# Patient Record
Sex: Female | Born: 1955 | Race: Black or African American | Hispanic: No
Health system: Midwestern US, Community
[De-identification: ages and names within clinical notes are randomized; demographics above are authoritative.]

## PROBLEM LIST (undated history)

## (undated) DIAGNOSIS — Z1231 Encounter for screening mammogram for malignant neoplasm of breast: Secondary | ICD-10-CM

## (undated) DIAGNOSIS — R109 Unspecified abdominal pain: Secondary | ICD-10-CM

## (undated) DIAGNOSIS — E119 Type 2 diabetes mellitus without complications: Secondary | ICD-10-CM

## (undated) DIAGNOSIS — I10 Essential (primary) hypertension: Secondary | ICD-10-CM

## (undated) DIAGNOSIS — M109 Gout, unspecified: Secondary | ICD-10-CM

## (undated) DIAGNOSIS — R053 Chronic cough: Secondary | ICD-10-CM

## (undated) DIAGNOSIS — R05 Cough: Secondary | ICD-10-CM

## (undated) DIAGNOSIS — R42 Dizziness and giddiness: Secondary | ICD-10-CM

## (undated) DIAGNOSIS — J449 Chronic obstructive pulmonary disease, unspecified: Secondary | ICD-10-CM

## (undated) DIAGNOSIS — C160 Malignant neoplasm of cardia: Secondary | ICD-10-CM

## (undated) DIAGNOSIS — Z1239 Encounter for other screening for malignant neoplasm of breast: Secondary | ICD-10-CM

## (undated) DIAGNOSIS — Z1211 Encounter for screening for malignant neoplasm of colon: Secondary | ICD-10-CM

## (undated) DIAGNOSIS — C7931 Secondary malignant neoplasm of brain: Secondary | ICD-10-CM

## (undated) DIAGNOSIS — Z79899 Other long term (current) drug therapy: Secondary | ICD-10-CM

---

## 1975-07-23 HISTORY — PX: HAND SURGERY: SHX662

## 2000-07-18 ENCOUNTER — Emergency Department (HOSPITAL_COMMUNITY): Admission: EM | Admit: 2000-07-18 | Discharge: 2000-07-18 | Payer: Self-pay | Admitting: Emergency Medicine

## 2000-09-22 ENCOUNTER — Emergency Department (HOSPITAL_COMMUNITY): Admission: EM | Admit: 2000-09-22 | Discharge: 2000-09-22 | Payer: Self-pay | Admitting: Emergency Medicine

## 2000-09-23 ENCOUNTER — Emergency Department (HOSPITAL_COMMUNITY): Admission: EM | Admit: 2000-09-23 | Discharge: 2000-09-23 | Payer: Self-pay | Admitting: Emergency Medicine

## 2001-03-11 ENCOUNTER — Emergency Department (HOSPITAL_COMMUNITY): Admission: EM | Admit: 2001-03-11 | Discharge: 2001-03-11 | Payer: Self-pay | Admitting: Emergency Medicine

## 2001-11-20 ENCOUNTER — Emergency Department (HOSPITAL_COMMUNITY): Admission: EM | Admit: 2001-11-20 | Discharge: 2001-11-20 | Payer: Self-pay | Admitting: Emergency Medicine

## 2001-11-20 ENCOUNTER — Encounter: Payer: Self-pay | Admitting: Emergency Medicine

## 2001-12-22 ENCOUNTER — Emergency Department (HOSPITAL_COMMUNITY): Admission: EM | Admit: 2001-12-22 | Discharge: 2001-12-22 | Payer: Self-pay

## 2004-05-21 ENCOUNTER — Ambulatory Visit: Payer: Self-pay | Admitting: Internal Medicine

## 2004-05-22 ENCOUNTER — Ambulatory Visit: Payer: Self-pay | Admitting: *Deleted

## 2004-06-30 ENCOUNTER — Emergency Department (HOSPITAL_COMMUNITY): Admission: EM | Admit: 2004-06-30 | Discharge: 2004-06-30 | Payer: Self-pay

## 2004-10-11 ENCOUNTER — Ambulatory Visit: Payer: Self-pay | Admitting: Internal Medicine

## 2005-05-21 ENCOUNTER — Ambulatory Visit: Payer: Self-pay | Admitting: Internal Medicine

## 2005-06-04 ENCOUNTER — Ambulatory Visit: Payer: Self-pay | Admitting: Internal Medicine

## 2005-06-24 ENCOUNTER — Ambulatory Visit: Payer: Self-pay | Admitting: Internal Medicine

## 2005-09-16 ENCOUNTER — Ambulatory Visit: Payer: Self-pay | Admitting: Internal Medicine

## 2006-03-07 ENCOUNTER — Ambulatory Visit: Payer: Self-pay | Admitting: Internal Medicine

## 2006-09-08 ENCOUNTER — Ambulatory Visit: Payer: Self-pay | Admitting: Internal Medicine

## 2006-10-06 ENCOUNTER — Encounter: Payer: Self-pay | Admitting: Internal Medicine

## 2006-10-06 ENCOUNTER — Ambulatory Visit: Payer: Self-pay | Admitting: Internal Medicine

## 2007-01-16 ENCOUNTER — Ambulatory Visit: Payer: Self-pay | Admitting: Internal Medicine

## 2007-01-19 ENCOUNTER — Ambulatory Visit (HOSPITAL_COMMUNITY): Admission: RE | Admit: 2007-01-19 | Discharge: 2007-01-19 | Payer: Self-pay | Admitting: Internal Medicine

## 2007-01-30 ENCOUNTER — Ambulatory Visit: Payer: Self-pay | Admitting: Internal Medicine

## 2007-04-08 ENCOUNTER — Encounter (INDEPENDENT_AMBULATORY_CARE_PROVIDER_SITE_OTHER): Payer: Self-pay | Admitting: *Deleted

## 2007-05-25 ENCOUNTER — Ambulatory Visit: Payer: Self-pay | Admitting: Internal Medicine

## 2007-05-28 ENCOUNTER — Ambulatory Visit: Payer: Self-pay | Admitting: Internal Medicine

## 2007-07-20 ENCOUNTER — Emergency Department (HOSPITAL_COMMUNITY): Admission: EM | Admit: 2007-07-20 | Discharge: 2007-07-20 | Payer: Self-pay | Admitting: Emergency Medicine

## 2007-07-30 ENCOUNTER — Ambulatory Visit: Payer: Self-pay | Admitting: Internal Medicine

## 2007-07-31 ENCOUNTER — Ambulatory Visit (HOSPITAL_COMMUNITY): Admission: RE | Admit: 2007-07-31 | Discharge: 2007-07-31 | Payer: Self-pay | Admitting: Family Medicine

## 2007-10-28 ENCOUNTER — Encounter: Payer: Self-pay | Admitting: Internal Medicine

## 2007-10-28 ENCOUNTER — Ambulatory Visit: Payer: Self-pay | Admitting: Internal Medicine

## 2007-10-28 LAB — CONVERTED CEMR LAB
CO2: 26 meq/L (ref 19–32)
Chloride: 107 meq/L (ref 96–112)
Glucose, Bld: 89 mg/dL (ref 70–99)
Potassium: 4 meq/L (ref 3.5–5.3)
Sodium: 141 meq/L (ref 135–145)

## 2008-04-01 ENCOUNTER — Emergency Department (HOSPITAL_COMMUNITY): Admission: EM | Admit: 2008-04-01 | Discharge: 2008-04-01 | Payer: Self-pay | Admitting: Emergency Medicine

## 2008-08-25 ENCOUNTER — Ambulatory Visit: Payer: Self-pay | Admitting: Internal Medicine

## 2008-08-25 LAB — CONVERTED CEMR LAB
BUN: 7 mg/dL (ref 6–23)
Chloride: 105 meq/L (ref 96–112)
Glucose, Bld: 93 mg/dL (ref 70–99)
LDL Cholesterol: 114 mg/dL — ABNORMAL HIGH (ref 0–99)
Potassium: 3.8 meq/L (ref 3.5–5.3)
VLDL: 16 mg/dL (ref 0–40)

## 2008-09-22 ENCOUNTER — Encounter: Payer: Self-pay | Admitting: Internal Medicine

## 2008-09-22 ENCOUNTER — Ambulatory Visit: Payer: Self-pay | Admitting: Internal Medicine

## 2008-09-22 LAB — CONVERTED CEMR LAB
Chlamydia, DNA Probe: NEGATIVE
GC Probe Amp, Genital: NEGATIVE

## 2008-09-23 ENCOUNTER — Ambulatory Visit (HOSPITAL_COMMUNITY): Admission: RE | Admit: 2008-09-23 | Discharge: 2008-09-23 | Payer: Self-pay | Admitting: Internal Medicine

## 2008-10-03 ENCOUNTER — Emergency Department (HOSPITAL_COMMUNITY): Admission: EM | Admit: 2008-10-03 | Discharge: 2008-10-03 | Payer: Self-pay | Admitting: Emergency Medicine

## 2008-11-07 ENCOUNTER — Ambulatory Visit: Payer: Self-pay | Admitting: Internal Medicine

## 2009-01-16 ENCOUNTER — Ambulatory Visit: Payer: Self-pay | Admitting: Internal Medicine

## 2009-06-28 ENCOUNTER — Ambulatory Visit: Payer: Self-pay | Admitting: Internal Medicine

## 2010-02-05 ENCOUNTER — Ambulatory Visit: Payer: Self-pay | Admitting: Internal Medicine

## 2010-02-05 ENCOUNTER — Ambulatory Visit (HOSPITAL_COMMUNITY): Admission: RE | Admit: 2010-02-05 | Discharge: 2010-02-05 | Payer: Self-pay | Admitting: Internal Medicine

## 2010-02-05 LAB — CONVERTED CEMR LAB: Pap Smear: NEGATIVE

## 2010-02-14 ENCOUNTER — Ambulatory Visit (HOSPITAL_COMMUNITY): Admission: RE | Admit: 2010-02-14 | Discharge: 2010-02-14 | Payer: Self-pay | Admitting: Internal Medicine

## 2011-01-16 ENCOUNTER — Emergency Department (HOSPITAL_COMMUNITY)
Admission: EM | Admit: 2011-01-16 | Discharge: 2011-01-16 | Disposition: A | Discharge status: Home / Self Care | Payer: Self-pay | Attending: Emergency Medicine | Admitting: Emergency Medicine

## 2011-01-16 DIAGNOSIS — L02818 Cutaneous abscess of other sites: Secondary | ICD-10-CM | POA: Insufficient documentation

## 2011-01-16 DIAGNOSIS — L03818 Cellulitis of other sites: Secondary | ICD-10-CM | POA: Insufficient documentation

## 2011-01-16 DIAGNOSIS — I10 Essential (primary) hypertension: Secondary | ICD-10-CM | POA: Insufficient documentation

## 2011-01-16 DIAGNOSIS — I509 Heart failure, unspecified: Secondary | ICD-10-CM | POA: Insufficient documentation

## 2013-12-02 ENCOUNTER — Ambulatory Visit: Payer: No Typology Code available for payment source | Attending: Internal Medicine

## 2013-12-17 ENCOUNTER — Ambulatory Visit (INDEPENDENT_AMBULATORY_CARE_PROVIDER_SITE_OTHER): Payer: No Typology Code available for payment source | Admitting: Family Medicine

## 2013-12-17 ENCOUNTER — Other Ambulatory Visit: Payer: Self-pay | Admitting: Internal Medicine

## 2013-12-17 ENCOUNTER — Encounter: Payer: Self-pay | Admitting: Family Medicine

## 2013-12-17 VITALS — BP 153/94 | HR 74 | Temp 98.3°F | Resp 20 | Ht 66.0 in | Wt 180.0 lb

## 2013-12-17 DIAGNOSIS — R635 Abnormal weight gain: Secondary | ICD-10-CM

## 2013-12-17 DIAGNOSIS — M109 Gout, unspecified: Secondary | ICD-10-CM

## 2013-12-17 DIAGNOSIS — Z862 Personal history of diseases of the blood and blood-forming organs and certain disorders involving the immune mechanism: Secondary | ICD-10-CM

## 2013-12-17 DIAGNOSIS — Z8739 Personal history of other diseases of the musculoskeletal system and connective tissue: Secondary | ICD-10-CM

## 2013-12-17 DIAGNOSIS — I10 Essential (primary) hypertension: Secondary | ICD-10-CM

## 2013-12-17 DIAGNOSIS — Z8639 Personal history of other endocrine, nutritional and metabolic disease: Secondary | ICD-10-CM

## 2013-12-17 DIAGNOSIS — M79675 Pain in left toe(s): Secondary | ICD-10-CM

## 2013-12-17 DIAGNOSIS — Z1231 Encounter for screening mammogram for malignant neoplasm of breast: Secondary | ICD-10-CM

## 2013-12-17 DIAGNOSIS — M79609 Pain in unspecified limb: Secondary | ICD-10-CM

## 2013-12-17 LAB — COMPLETE METABOLIC PANEL WITH GFR
ALK PHOS: 68 U/L (ref 39–117)
ALT: 12 U/L (ref 0–35)
AST: 14 U/L (ref 0–37)
Albumin: 4.2 g/dL (ref 3.5–5.2)
BILIRUBIN TOTAL: 0.4 mg/dL (ref 0.2–1.2)
BUN: 8 mg/dL (ref 6–23)
CO2: 25 mEq/L (ref 19–32)
Calcium: 9.2 mg/dL (ref 8.4–10.5)
Chloride: 104 mEq/L (ref 96–112)
Creat: 0.89 mg/dL (ref 0.50–1.10)
GFR, EST NON AFRICAN AMERICAN: 72 mL/min
GFR, Est African American: 83 mL/min
GLUCOSE: 83 mg/dL (ref 70–99)
Potassium: 4.1 mEq/L (ref 3.5–5.3)
SODIUM: 139 meq/L (ref 135–145)
TOTAL PROTEIN: 6.9 g/dL (ref 6.0–8.3)

## 2013-12-17 LAB — CBC WITH DIFFERENTIAL/PLATELET
BASOS ABS: 0.1 10*3/uL (ref 0.0–0.1)
BASOS PCT: 1 % (ref 0–1)
EOS ABS: 0.3 10*3/uL (ref 0.0–0.7)
EOS PCT: 4 % (ref 0–5)
HEMATOCRIT: 37.5 % (ref 36.0–46.0)
Hemoglobin: 12 g/dL (ref 12.0–15.0)
Lymphocytes Relative: 48 % — ABNORMAL HIGH (ref 12–46)
Lymphs Abs: 3.2 10*3/uL (ref 0.7–4.0)
MCH: 23 pg — ABNORMAL LOW (ref 26.0–34.0)
MCHC: 32 g/dL (ref 30.0–36.0)
MCV: 72 fL — AB (ref 78.0–100.0)
MONO ABS: 0.5 10*3/uL (ref 0.1–1.0)
Monocytes Relative: 7 % (ref 3–12)
Neutro Abs: 2.6 10*3/uL (ref 1.7–7.7)
Neutrophils Relative %: 40 % — ABNORMAL LOW (ref 43–77)
Platelets: 236 10*3/uL (ref 150–400)
RBC: 5.21 MIL/uL — ABNORMAL HIGH (ref 3.87–5.11)
RDW: 15.5 % (ref 11.5–15.5)
WBC: 6.6 10*3/uL (ref 4.0–10.5)

## 2013-12-17 LAB — HEMOGLOBIN A1C
Hgb A1c MFr Bld: 6.6 % — ABNORMAL HIGH (ref <5.7)
Mean Plasma Glucose: 143 mg/dL — ABNORMAL HIGH (ref <117)

## 2013-12-17 LAB — URIC ACID: Uric Acid, Serum: 4.3 mg/dL (ref 2.4–7.0)

## 2013-12-17 MED ORDER — IBUPROFEN 600 MG PO TABS: 600.0000 mg | ORAL_TABLET | Freq: Three times a day (TID) | ORAL | Status: DC | PRN

## 2013-12-17 NOTE — Progress Notes (Signed)
   Subjective:    Patient ID: Nichole Best, female    DOB: 11-30-1955, 58 y.o.   MRN: [id number]  HPI Patient in office to establish care.  Patient has a history of hypertension. Reports that she does not follow a consistent diet regimen, but remains active. She takes her medications consistently.   Review of Systems  Constitutional: Negative.   HENT: Negative.   Eyes: Negative.   Respiratory: Negative.   Cardiovascular: Negative.   Gastrointestinal: Negative.   Endocrine: Negative.   Genitourinary: Negative.   Musculoskeletal: Positive for arthralgias, joint swelling and myalgias.  Skin: Negative.   Allergic/Immunologic: Negative.   Neurological: Positive for dizziness (vertigo).  Hematological: Negative.   Psychiatric/Behavioral: Negative.        Objective:   Physical Exam  Constitutional: She is oriented to person, place, and time. She appears well-developed and well-nourished.  HENT:  Head: Normocephalic and atraumatic.  Eyes: Pupils are equal, round, and reactive to light.  Neck: Normal range of motion. Neck supple.  Cardiovascular: Normal rate, regular rhythm and normal heart sounds.   Pulmonary/Chest: Effort normal and breath sounds normal.  Abdominal: Soft. Bowel sounds are normal.  Musculoskeletal: Normal range of motion. She exhibits edema.       Left ankle: She exhibits normal range of motion and no swelling. Tenderness.  Neurological: She is alert and oriented to person, place, and time. She has normal reflexes.  Skin: Skin is warm and dry.  Psychiatric: She has a normal mood and affect. Her behavior is normal. Judgment and thought content normal.          Assessment & Plan:  1. Hypertension: Continue current medication regimen. Will review medical records as they become available. Recommend 1800 calorie diet divided over 6 small meals. Increase water intake to 6-8 glasses.  Continue medications regimen. Check CMP, urinalysis.  2. Left great toe pain:  Start Ibuprofen 600 mg three times daily as needed with food.  3. History of gout- Check uric acid level 4. Weight gain-Check CMP, Hgb A1C 5. Dizziness: Reports that she occasionally has dizziness. Orthostatic blood pressures within normal limits. Denies ear ringing, headache, weakness, or blurred vision. Patient to Continue Meclizine 12.5 mg three times per day as previously prescribed.  Post menopausal- LMP-Nov 2011  Last eye exam: 1 month ago at the [facility] Post menopausal Colonoscopy greater than 7 years ago. Dr. Jules Schick Breast Cancer Screening 2 years ago-Sent Referral for screening mammogram  Vaccinations: Tetanus in 2013 [facility]).    Labs: HbA1c, CMP, CBC w/diff Will review medical records as they become available  RTC: 3 months for a CPE with DR. Ashley Royalty.

## 2013-12-18 LAB — URINALYSIS, COMPLETE
Bacteria, UA: NONE SEEN
Bilirubin Urine: NEGATIVE
CASTS: NONE SEEN
CRYSTALS: NONE SEEN
GLUCOSE, UA: NEGATIVE mg/dL
Hgb urine dipstick: NEGATIVE
KETONES UR: NEGATIVE mg/dL
LEUKOCYTES UA: NEGATIVE
NITRITE: NEGATIVE
PH: 6.5 (ref 5.0–8.0)
Protein, ur: NEGATIVE mg/dL
SPECIFIC GRAVITY, URINE: 1.012 (ref 1.005–1.030)
Urobilinogen, UA: 0.2 mg/dL (ref 0.0–1.0)

## 2013-12-20 ENCOUNTER — Encounter: Payer: Self-pay | Admitting: Family Medicine

## 2013-12-21 DIAGNOSIS — Z1231 Encounter for screening mammogram for malignant neoplasm of breast: Secondary | ICD-10-CM | POA: Insufficient documentation

## 2013-12-21 DIAGNOSIS — Z8739 Personal history of other diseases of the musculoskeletal system and connective tissue: Secondary | ICD-10-CM | POA: Insufficient documentation

## 2013-12-21 DIAGNOSIS — I10 Essential (primary) hypertension: Secondary | ICD-10-CM | POA: Insufficient documentation

## 2013-12-21 DIAGNOSIS — R635 Abnormal weight gain: Secondary | ICD-10-CM | POA: Insufficient documentation

## 2013-12-21 DIAGNOSIS — M79675 Pain in left toe(s): Secondary | ICD-10-CM | POA: Insufficient documentation

## 2013-12-21 DIAGNOSIS — M109 Gout, unspecified: Secondary | ICD-10-CM | POA: Insufficient documentation

## 2013-12-30 ENCOUNTER — Ambulatory Visit (HOSPITAL_COMMUNITY)
Admission: RE | Admit: 2013-12-30 | Discharge: 2013-12-30 | Disposition: A | Discharge status: Home / Self Care | Payer: No Typology Code available for payment source | Source: Ambulatory Visit | Attending: Family Medicine | Admitting: Family Medicine

## 2013-12-30 DIAGNOSIS — Z1231 Encounter for screening mammogram for malignant neoplasm of breast: Secondary | ICD-10-CM | POA: Insufficient documentation

## 2014-03-24 ENCOUNTER — Encounter: Payer: No Typology Code available for payment source | Admitting: Internal Medicine

## 2014-05-06 ENCOUNTER — Other Ambulatory Visit: Payer: Self-pay

## 2015-05-01 ENCOUNTER — Telehealth: Payer: Self-pay | Admitting: Family Medicine

## 2015-05-01 NOTE — Telephone Encounter (Signed)
Received medical records request from [employer]. Forwarded to [employer].

## 2015-09-24 ENCOUNTER — Emergency Department (HOSPITAL_COMMUNITY): Payer: Medicare Other

## 2015-09-24 ENCOUNTER — Encounter (HOSPITAL_COMMUNITY): Payer: Self-pay | Admitting: *Deleted

## 2015-09-24 ENCOUNTER — Emergency Department (HOSPITAL_COMMUNITY)
Admission: EM | Admit: 2015-09-24 | Discharge: 2015-09-24 | Disposition: A | Discharge status: Home / Self Care | Payer: Medicare Other | Attending: Emergency Medicine | Admitting: Emergency Medicine

## 2015-09-24 DIAGNOSIS — R42 Dizziness and giddiness: Secondary | ICD-10-CM | POA: Diagnosis present

## 2015-09-24 DIAGNOSIS — F17210 Nicotine dependence, cigarettes, uncomplicated: Secondary | ICD-10-CM | POA: Insufficient documentation

## 2015-09-24 DIAGNOSIS — E119 Type 2 diabetes mellitus without complications: Secondary | ICD-10-CM | POA: Diagnosis not present

## 2015-09-24 DIAGNOSIS — Z79899 Other long term (current) drug therapy: Secondary | ICD-10-CM | POA: Diagnosis not present

## 2015-09-24 DIAGNOSIS — R110 Nausea: Secondary | ICD-10-CM | POA: Diagnosis not present

## 2015-09-24 DIAGNOSIS — I10 Essential (primary) hypertension: Secondary | ICD-10-CM | POA: Insufficient documentation

## 2015-09-24 DIAGNOSIS — R0981 Nasal congestion: Secondary | ICD-10-CM | POA: Diagnosis not present

## 2015-09-24 HISTORY — DX: Essential (primary) hypertension: I10

## 2015-09-24 HISTORY — DX: Dizziness and giddiness: R42

## 2015-09-24 HISTORY — DX: Type 2 diabetes mellitus without complications: E11.9

## 2015-09-24 LAB — URINALYSIS, ROUTINE W REFLEX MICROSCOPIC
BILIRUBIN URINE: NEGATIVE
Glucose, UA: NEGATIVE mg/dL
HGB URINE DIPSTICK: NEGATIVE
Ketones, ur: NEGATIVE mg/dL
Nitrite: NEGATIVE
PH: 5.5 (ref 5.0–8.0)
Protein, ur: NEGATIVE mg/dL
SPECIFIC GRAVITY, URINE: 1.02 (ref 1.005–1.030)

## 2015-09-24 LAB — PROTIME-INR
INR: 1.04 (ref 0.00–1.49)
PROTHROMBIN TIME: 13.8 s (ref 11.6–15.2)

## 2015-09-24 LAB — CBC
HCT: 37.5 % (ref 36.0–46.0)
HEMOGLOBIN: 11.9 g/dL — AB (ref 12.0–15.0)
MCH: 23.6 pg — AB (ref 26.0–34.0)
MCHC: 31.7 g/dL (ref 30.0–36.0)
MCV: 74.3 fL — ABNORMAL LOW (ref 78.0–100.0)
Platelets: 245 10*3/uL (ref 150–400)
RBC: 5.05 MIL/uL (ref 3.87–5.11)
RDW: 15.1 % (ref 11.5–15.5)
WBC: 7.1 10*3/uL (ref 4.0–10.5)

## 2015-09-24 LAB — COMPREHENSIVE METABOLIC PANEL
ALBUMIN: 3.8 g/dL (ref 3.5–5.0)
ALK PHOS: 64 U/L (ref 38–126)
ALT: 14 U/L (ref 14–54)
AST: 17 U/L (ref 15–41)
Anion gap: 11 (ref 5–15)
BILIRUBIN TOTAL: 0.3 mg/dL (ref 0.3–1.2)
BUN: 7 mg/dL (ref 6–20)
CALCIUM: 9.2 mg/dL (ref 8.9–10.3)
CO2: 26 mmol/L (ref 22–32)
Chloride: 105 mmol/L (ref 101–111)
Creatinine, Ser: 0.9 mg/dL (ref 0.44–1.00)
GFR calc Af Amer: >60 mL/min (ref >60)
GFR calc non Af Amer: >60 mL/min (ref >60)
GLUCOSE: 93 mg/dL (ref 65–99)
Potassium: 4.1 mmol/L (ref 3.5–5.1)
Sodium: 142 mmol/L (ref 135–145)
TOTAL PROTEIN: 6.7 g/dL (ref 6.5–8.1)

## 2015-09-24 LAB — URINE MICROSCOPIC-ADD ON

## 2015-09-24 LAB — CBG MONITORING, ED: Glucose-Capillary: 80 mg/dL (ref 65–99)

## 2015-09-24 LAB — DIFFERENTIAL
BASOS ABS: 0 10*3/uL (ref 0.0–0.1)
Basophils Relative: 0 %
Eosinophils Absolute: 0.2 10*3/uL (ref 0.0–0.7)
Eosinophils Relative: 3 %
LYMPHS ABS: 3.8 10*3/uL (ref 0.7–4.0)
LYMPHS PCT: 54 %
Monocytes Absolute: 0.4 10*3/uL (ref 0.1–1.0)
Monocytes Relative: 6 %
NEUTROS PCT: 37 %
Neutro Abs: 2.6 10*3/uL (ref 1.7–7.7)

## 2015-09-24 LAB — APTT: aPTT: 31 seconds (ref 24–37)

## 2015-09-24 LAB — I-STAT TROPONIN, ED: Troponin i, poc: 0 ng/mL (ref 0.00–0.08)

## 2015-09-24 MED ORDER — MECLIZINE HCL 25 MG PO TABS
25.0000 mg | ORAL_TABLET | Freq: Once | ORAL | Status: AC
Start: 2015-09-24 — End: 2015-09-24
  Administered 2015-09-24: 25 mg via ORAL
  Filled 2015-09-24: qty 1

## 2015-09-24 MED ORDER — MECLIZINE HCL 25 MG PO TABS: 25.0000 mg | ORAL_TABLET | Freq: Three times a day (TID) | ORAL | Status: DC | PRN

## 2015-09-24 NOTE — Discharge Instructions (Signed)
Benign Positional Vertigo °Vertigo is the feeling that you or your surroundings are moving when they are not. Benign positional vertigo is the most common form of vertigo. The cause of this condition is not serious (is benign). This condition is triggered by certain movements and positions (is positional). This condition can be dangerous if it occurs while you are doing something that could endanger you or others, such as driving.  °CAUSES °In many cases, the cause of this condition is not known. It may be caused by a disturbance in an area of the inner ear that helps your brain to sense movement and balance. This disturbance can be caused by a viral infection (labyrinthitis), head injury, or repetitive motion. °RISK FACTORS °This condition is more likely to develop in: °· Women. °· People who are 50 years of age or older. °SYMPTOMS °Symptoms of this condition usually happen when you move your head or your eyes in different directions. Symptoms may start suddenly, and they usually last for less than a minute. Symptoms may include: °· Loss of balance and falling. °· Feeling like you are spinning or moving. °· Feeling like your surroundings are spinning or moving. °· Nausea and vomiting. °· Blurred vision. °· Dizziness. °· Involuntary eye movement (nystagmus). °Symptoms can be mild and cause only slight annoyance, or they can be severe and interfere with daily life. Episodes of benign positional vertigo may return (recur) over time, and they may be triggered by certain movements. Symptoms may improve over time. °DIAGNOSIS °This condition is usually diagnosed by medical history and a physical exam of the head, neck, and ears. You may be referred to a health care provider who specializes in ear, nose, and throat (ENT) problems (otolaryngologist) or a provider who specializes in disorders of the nervous system (neurologist). You may have additional testing, including: °· MRI. °· A CT scan. °· Eye movement tests. Your  health care provider may ask you to change positions quickly while he or she watches you for symptoms of benign positional vertigo, such as nystagmus. Eye movement may be tested with an electronystagmogram (ENG), caloric stimulation, the Dix-Hallpike test, or the roll test. °· An electroencephalogram (EEG). This records electrical activity in your brain. °· Hearing tests. °TREATMENT °Usually, your health care provider will treat this by moving your head in specific positions to adjust your inner ear back to normal. Surgery may be needed in severe cases, but this is rare. In some cases, benign positional vertigo may resolve on its own in 2-4 weeks. °HOME CARE INSTRUCTIONS °Safety °· Move slowly. Avoid sudden body or head movements. °· Avoid driving. °· Avoid operating heavy machinery. °· Avoid doing any tasks that would be dangerous to you or others if a vertigo episode would occur. °· If you have trouble walking or keeping your balance, try using a cane for stability. If you feel dizzy or unstable, sit down right away. °· Return to your normal activities as told by your health care provider. Ask your health care provider what activities are safe for you. °General Instructions °· Take over-the-counter and prescription medicines only as told by your health care provider. °· Avoid certain positions or movements as told by your health care provider. °· Drink enough fluid to keep your urine clear or pale yellow. °· Keep all follow-up visits as told by your health care provider. This is important. °SEEK MEDICAL CARE IF: °· You have a fever. °· Your condition gets worse or you develop new symptoms. °· Your family or friends   notice any behavioral changes.  Your nausea or vomiting gets worse.  You have numbness or a "pins and needles" sensation. SEEK IMMEDIATE MEDICAL CARE IF:  You have difficulty speaking or moving.  You are always dizzy.  You faint.  You develop severe headaches.  You have weakness in your  legs or arms.  You have changes in your hearing or vision.  You develop a stiff neck.  You develop sensitivity to light.   This information is not intended to replace advice given to you by your health care provider. Make sure you discuss any questions you have with your health care provider.   Take the Antivert as directed. Make an appointment to follow-up with the wellness clinic. Return for any new or worse symptoms. Today's workup to include MRI of the brain without any acute findings. Patient cleared to return back to shelter.   DPatient with historyocument Released: 04/15/2006 Document Revised: 03/29/2015 Document Reviewed: 10/31/2014 Elsevier Interactive Patient Education [employer]2016 [employer] Inc.

## 2015-09-24 NOTE — ED Notes (Addendum)
Pt reports feeling lightheaded and dizzy since she woke up at 4am. No neuro deficits noted at triage. Pt reports hx of vertigo.

## 2015-09-24 NOTE — ED Provider Notes (Signed)
CSN: [id number]     Arrival date & time 09/24/15  1157 History   First MD Initiated Contact with Patient 09/24/15 1522     Chief Complaint  Patient presents with  . Dizziness     (Consider location/radiation/quality/duration/timing/severity/associated sxs/prior Treatment) Patient is a 60 y.o. female presenting with dizziness. The history is provided by the patient.  Dizziness Associated symptoms: nausea   Associated symptoms: no chest pain, no headaches and no shortness of breath    patient with acute onset of room spinning dizziness a little bit of lightheadedness mild congestion all starting at 4:00 this morning. Patient without fever. No cough. Patient had a history of vertigo several years ago in [place] was treated with meclizine. Patient's past medical history has a history of diabetes without complications.  Past Medical History  Diagnosis Date  . Diabetes mellitus without complication (HCC)   . Hypertension   . Vertigo    History reviewed. No pertinent past surgical history. History reviewed. No pertinent family history. Social History  Substance Use Topics  . Smoking status: Current Every Day Smoker -- 0.25 packs/day    Types: Cigarettes  . Smokeless tobacco: None     Comment: Pt has been smoking since age 79  . Alcohol Use: No     Comment: Caffene user   OB History    No data available     Review of Systems  Constitutional: Negative for fever.  HENT: Positive for congestion.   Eyes: Negative for visual disturbance.  Respiratory: Negative for shortness of breath.   Cardiovascular: Negative for chest pain.  Gastrointestinal: Positive for nausea. Negative for abdominal pain.  Genitourinary: Negative for dysuria.  Musculoskeletal: Negative for back pain and neck pain.  Skin: Negative for rash.  Neurological: Positive for dizziness and light-headedness. Negative for headaches.  Hematological: Does not bruise/bleed easily.  Psychiatric/Behavioral: Negative for  confusion.      Allergies  Codeine and Lisinopril  Home Medications   Prior to Admission medications   Medication Sig Start Date End Date Taking? Authorizing Provider  losartan (COZAAR) 50 MG tablet Take 50 mg by mouth daily.   Yes Historical Provider, MD  naproxen sodium (ANAPROX) 220 MG tablet Take 220 mg by mouth 2 (two) times daily as needed (for pain).   Yes Historical Provider, MD  ibuprofen (ADVIL,MOTRIN) 600 MG tablet Take 1 tablet (600 mg total) by mouth every 8 (eight) hours as needed. 12/17/13   Massie Maroon, FNP  meclizine (ANTIVERT) 25 MG tablet Take 1 tablet (25 mg total) by mouth 3 (three) times daily as needed for dizziness. 09/24/15   Vanetta Mulders, MD   BP 137/89 mmHg  Pulse 73  Temp(Src) 98.4 F (36.9 C) (Oral)  Resp 17  SpO2 96% Physical Exam  Constitutional: She is oriented to person, place, and time. She appears well-developed and well-nourished. No distress.  HENT:  Head: Normocephalic and atraumatic.  Right Ear: External ear normal.  Left Ear: External ear normal.  Mouth/Throat: Oropharynx is clear and moist.  Extraocular movements and head movement illicit dizziness.  Eyes: Conjunctivae and EOM are normal. Pupils are equal, round, and reactive to light.  Neck: Normal range of motion. Neck supple.  Cardiovascular: Normal rate, regular rhythm and normal heart sounds.   No murmur heard. Pulmonary/Chest: Effort normal and breath sounds normal.  Abdominal: Soft. Bowel sounds are normal. There is no tenderness.  Musculoskeletal: Normal range of motion.  Neurological: She is alert and oriented to person, place, and time. No  cranial nerve deficit. She exhibits normal muscle tone. Coordination normal.  Skin: Skin is warm. No rash noted.  Nursing note and vitals reviewed.   ED Course  Procedures (including critical care time) Labs Review Labs Reviewed  CBC - Abnormal; Notable for the following:    Hemoglobin 11.9 (*)    MCV 74.3 (*)    MCH 23.6 (*)     All other components within normal limits  URINALYSIS, ROUTINE W REFLEX MICROSCOPIC (NOT AT [facility]) - Abnormal; Notable for the following:    Leukocytes, UA TRACE (*)    All other components within normal limits  URINE MICROSCOPIC-ADD ON - Abnormal; Notable for the following:    Squamous Epithelial / LPF 0-5 (*)    Bacteria, UA RARE (*)    All other components within normal limits  PROTIME-INR  APTT  DIFFERENTIAL  COMPREHENSIVE METABOLIC PANEL  CBG MONITORING, ED  I-STAT TROPOININ, ED  CBG MONITORING, ED   Results for orders placed or performed during the hospital encounter of 09/24/15  Protime-INR  Result Value Ref Range   Prothrombin Time 13.8 11.6 - 15.2 seconds   INR 1.04 0.00 - 1.49  APTT  Result Value Ref Range   aPTT 31 24 - 37 seconds  CBC  Result Value Ref Range   WBC 7.1 4.0 - 10.5 K/uL   RBC 5.05 3.87 - 5.11 MIL/uL   Hemoglobin 11.9 (L) 12.0 - 15.0 g/dL   HCT 16.1 09.6 - 04.5 %   MCV 74.3 (L) 78.0 - 100.0 fL   MCH 23.6 (L) 26.0 - 34.0 pg   MCHC 31.7 30.0 - 36.0 g/dL   RDW 40.9 81.1 - 91.4 %   Platelets 245 150 - 400 K/uL  Differential  Result Value Ref Range   Neutrophils Relative % 37 %   Neutro Abs 2.6 1.7 - 7.7 K/uL   Lymphocytes Relative 54 %   Lymphs Abs 3.8 0.7 - 4.0 K/uL   Monocytes Relative 6 %   Monocytes Absolute 0.4 0.1 - 1.0 K/uL   Eosinophils Relative 3 %   Eosinophils Absolute 0.2 0.0 - 0.7 K/uL   Basophils Relative 0 %   Basophils Absolute 0.0 0.0 - 0.1 K/uL  Comprehensive metabolic panel  Result Value Ref Range   Sodium 142 135 - 145 mmol/L   Potassium 4.1 3.5 - 5.1 mmol/L   Chloride 105 101 - 111 mmol/L   CO2 26 22 - 32 mmol/L   Glucose, Bld 93 65 - 99 mg/dL   BUN 7 6 - 20 mg/dL   Creatinine, Ser 7.82 0.44 - 1.00 mg/dL   Calcium 9.2 8.9 - 95.6 mg/dL   Total Protein 6.7 6.5 - 8.1 g/dL   Albumin 3.8 3.5 - 5.0 g/dL   AST 17 15 - 41 U/L   ALT 14 14 - 54 U/L   Alkaline Phosphatase 64 38 - 126 U/L   Total Bilirubin 0.3 0.3 - 1.2  mg/dL   GFR calc non Af Amer >60 >60 mL/min   GFR calc Af Amer >60 >60 mL/min   Anion gap 11 5 - 15  Urinalysis, Routine w reflex microscopic (not at [facility])  Result Value Ref Range   Color, Urine YELLOW YELLOW   APPearance CLEAR CLEAR   Specific Gravity, Urine 1.020 1.005 - 1.030   pH 5.5 5.0 - 8.0   Glucose, UA NEGATIVE NEGATIVE mg/dL   Hgb urine dipstick NEGATIVE NEGATIVE   Bilirubin Urine NEGATIVE NEGATIVE   Ketones, ur NEGATIVE NEGATIVE  mg/dL   Protein, ur NEGATIVE NEGATIVE mg/dL   Nitrite NEGATIVE NEGATIVE   Leukocytes, UA TRACE (A) NEGATIVE  Urine microscopic-add on  Result Value Ref Range   Squamous Epithelial / LPF 0-5 (A) NONE SEEN   WBC, UA 0-5 0 - 5 WBC/hpf   RBC / HPF 0-5 0 - 5 RBC/hpf   Bacteria, UA RARE (A) NONE SEEN   Urine-Other MUCOUS PRESENT   CBG monitoring, ED  Result Value Ref Range   Glucose-Capillary 80 65 - 99 mg/dL  I-stat troponin, ED ([facility])  Result Value Ref Range   Troponin i, poc 0.00 0.00 - 0.08 ng/mL   Comment 3          \ Results for orders placed or performed during the hospital encounter of 09/24/15  Protime-INR  Result Value Ref Range   Prothrombin Time 13.8 11.6 - 15.2 seconds   INR 1.04 0.00 - 1.49  APTT  Result Value Ref Range   aPTT 31 24 - 37 seconds  CBC  Result Value Ref Range   WBC 7.1 4.0 - 10.5 K/uL   RBC 5.05 3.87 - 5.11 MIL/uL   Hemoglobin 11.9 (L) 12.0 - 15.0 g/dL   HCT 91.437.5 78.236.0 - 95.646.0 %   MCV 74.3 (L) 78.0 - 100.0 fL   MCH 23.6 (L) 26.0 - 34.0 pg   MCHC 31.7 30.0 - 36.0 g/dL   RDW 21.315.1 08.611.5 - 57.815.5 %   Platelets 245 150 - 400 K/uL  Differential  Result Value Ref Range   Neutrophils Relative % 37 %   Neutro Abs 2.6 1.7 - 7.7 K/uL   Lymphocytes Relative 54 %   Lymphs Abs 3.8 0.7 - 4.0 K/uL   Monocytes Relative 6 %   Monocytes Absolute 0.4 0.1 - 1.0 K/uL   Eosinophils Relative 3 %   Eosinophils Absolute 0.2 0.0 - 0.7 K/uL   Basophils Relative 0 %   Basophils Absolute 0.0 0.0 - 0.1 K/uL   Comprehensive metabolic panel  Result Value Ref Range   Sodium 142 135 - 145 mmol/L   Potassium 4.1 3.5 - 5.1 mmol/L   Chloride 105 101 - 111 mmol/L   CO2 26 22 - 32 mmol/L   Glucose, Bld 93 65 - 99 mg/dL   BUN 7 6 - 20 mg/dL   Creatinine, Ser 4.690.90 0.44 - 1.00 mg/dL   Calcium 9.2 8.9 - 62.910.3 mg/dL   Total Protein 6.7 6.5 - 8.1 g/dL   Albumin 3.8 3.5 - 5.0 g/dL   AST 17 15 - 41 U/L   ALT 14 14 - 54 U/L   Alkaline Phosphatase 64 38 - 126 U/L   Total Bilirubin 0.3 0.3 - 1.2 mg/dL   GFR calc non Af Amer >60 >60 mL/min   GFR calc Af Amer >60 >60 mL/min   Anion gap 11 5 - 15  Urinalysis, Routine w reflex microscopic (not at [facility])  Result Value Ref Range   Color, Urine YELLOW YELLOW   APPearance CLEAR CLEAR   Specific Gravity, Urine 1.020 1.005 - 1.030   pH 5.5 5.0 - 8.0   Glucose, UA NEGATIVE NEGATIVE mg/dL   Hgb urine dipstick NEGATIVE NEGATIVE   Bilirubin Urine NEGATIVE NEGATIVE   Ketones, ur NEGATIVE NEGATIVE mg/dL   Protein, ur NEGATIVE NEGATIVE mg/dL   Nitrite NEGATIVE NEGATIVE   Leukocytes, UA TRACE (A) NEGATIVE  Urine microscopic-add on  Result Value Ref Range   Squamous Epithelial / LPF 0-5 (A) NONE  SEEN   WBC, UA 0-5 0 - 5 WBC/hpf   RBC / HPF 0-5 0 - 5 RBC/hpf   Bacteria, UA RARE (A) NONE SEEN   Urine-Other MUCOUS PRESENT   CBG monitoring, ED  Result Value Ref Range   Glucose-Capillary 80 65 - 99 mg/dL  I-stat troponin, ED (not at [facility])  Result Value Ref Range   Troponin i, poc 0.00 0.00 - 0.08 ng/mL   Comment 3             Imaging Review Ct Head Wo Contrast  09/24/2015  CLINICAL DATA:  Lightheadedness EXAM: CT HEAD WITHOUT CONTRAST TECHNIQUE: Contiguous axial images were obtained from the base of the skull through the vertex without intravenous contrast. COMPARISON:  None. FINDINGS: No acute cortical infarct, hemorrhage, or mass lesion ispresent. Ventricles are of normal size. No significant extra-axial fluid collection is present. The paranasal sinuses  andmastoid air cells are clear. The osseous skull is intact. IMPRESSION: Negative exam. Electronically Signed   By: Signa Kell M.D.   On: 09/24/2015 16:27   Mr Brain Wo Contrast  09/24/2015  CLINICAL DATA:  Vertigo since awakening this morning. EXAM: MRI HEAD WITHOUT CONTRAST TECHNIQUE: Multiplanar, multiecho pulse sequences of the brain and surrounding structures were obtained without intravenous contrast. COMPARISON:  Head CT from earlier FINDINGS: Calvarium and upper cervical spine: No focal mass like marrow signal abnormality. Orbits: Negative. Sinuses and Mastoids: Clear. Brain: No acute infarct, hemorrhage, hydrocephalus, or mass lesion. No evidence of large vessel occlusion. Abnormal number of patchy FLAIR hyperintensities in the cerebral white matter. 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 Demyelinating disease is considered given patient gender and age but there is no specific demyelinating pattern and most of the white matter disease is also not periventricular. IMPRESSION: 1. No acute finding, including infarct. 2. 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 Electronically Signed   By: Marnee Spring M.D.   On: 09/24/2015 20:49   I have personally reviewed and evaluated these images and lab results as part of my medical decision-making.   EKG Interpretation None      MDM   Final diagnoses:  Vertigo    Patient presents with a complaint of room spinning dizziness since 4:00 this morning. No focal neuro deficits. Head CT was negative MRI of the brain was negative. Symptoms consistent with vertigo. Patient improved significantly here with anti-vert. Will continue that.  Despite history of diabetes here blood sugars are well controlled.  Patiently given follow-up with the wellness clinic. Patient will return for any new or worse symptoms.   Vanetta Mulders, MD 09/24/15 2127

## 2015-11-18 ENCOUNTER — Emergency Department (HOSPITAL_COMMUNITY): Payer: Medicare Other

## 2015-11-18 ENCOUNTER — Encounter (HOSPITAL_COMMUNITY): Payer: Self-pay | Admitting: *Deleted

## 2015-11-18 ENCOUNTER — Observation Stay (HOSPITAL_COMMUNITY)
Admission: EM | Admit: 2015-11-18 | Discharge: 2015-11-19 | Disposition: A | Discharge status: Home / Self Care | Payer: Medicare Other | Attending: Internal Medicine | Admitting: Internal Medicine

## 2015-11-18 DIAGNOSIS — R079 Chest pain, unspecified: Secondary | ICD-10-CM | POA: Diagnosis present

## 2015-11-18 DIAGNOSIS — R7303 Prediabetes: Secondary | ICD-10-CM

## 2015-11-18 DIAGNOSIS — I252 Old myocardial infarction: Secondary | ICD-10-CM | POA: Diagnosis not present

## 2015-11-18 DIAGNOSIS — Z87891 Personal history of nicotine dependence: Secondary | ICD-10-CM | POA: Diagnosis not present

## 2015-11-18 DIAGNOSIS — J449 Chronic obstructive pulmonary disease, unspecified: Secondary | ICD-10-CM | POA: Insufficient documentation

## 2015-11-18 DIAGNOSIS — I440 Atrioventricular block, first degree: Secondary | ICD-10-CM | POA: Diagnosis not present

## 2015-11-18 DIAGNOSIS — R0789 Other chest pain: Secondary | ICD-10-CM | POA: Diagnosis not present

## 2015-11-18 DIAGNOSIS — I10 Essential (primary) hypertension: Secondary | ICD-10-CM | POA: Insufficient documentation

## 2015-11-18 DIAGNOSIS — E119 Type 2 diabetes mellitus without complications: Secondary | ICD-10-CM | POA: Insufficient documentation

## 2015-11-18 LAB — CBC
HCT: 36.5 % (ref 36.0–46.0)
Hemoglobin: 11 g/dL — ABNORMAL LOW (ref 12.0–15.0)
MCH: 22.8 pg — AB (ref 26.0–34.0)
MCHC: 30.1 g/dL (ref 30.0–36.0)
MCV: 75.7 fL — ABNORMAL LOW (ref 78.0–100.0)
PLATELETS: 229 10*3/uL (ref 150–400)
RBC: 4.82 MIL/uL (ref 3.87–5.11)
RDW: 15 % (ref 11.5–15.5)
WBC: 8.3 10*3/uL (ref 4.0–10.5)

## 2015-11-18 LAB — BASIC METABOLIC PANEL
Anion gap: 12 (ref 5–15)
BUN: 12 mg/dL (ref 6–20)
CALCIUM: 9.4 mg/dL (ref 8.9–10.3)
CO2: 24 mmol/L (ref 22–32)
CREATININE: 0.75 mg/dL (ref 0.44–1.00)
Chloride: 104 mmol/L (ref 101–111)
GFR calc Af Amer: >60 mL/min (ref >60)
GLUCOSE: 117 mg/dL — AB (ref 65–99)
Potassium: 4 mmol/L (ref 3.5–5.1)
Sodium: 140 mmol/L (ref 135–145)

## 2015-11-18 LAB — TROPONIN I: Troponin I: <0.03 ng/mL (ref <0.031)

## 2015-11-18 MED ORDER — GI COCKTAIL ~~LOC~~
30.0000 mL | Freq: Once | ORAL | Status: AC
  Administered 2015-11-18: 30 mL via ORAL
  Filled 2015-11-18: qty 30

## 2015-11-18 MED ORDER — SODIUM CHLORIDE 0.9 % IV BOLUS (SEPSIS)
1000.0000 mL | Freq: Once | INTRAVENOUS | Status: AC
  Administered 2015-11-18: 1000 mL via INTRAVENOUS

## 2015-11-18 NOTE — ED Notes (Signed)
The pt has had central chest pain  Since last pm with sob  None now  Dizziness and diaphoresis

## 2015-11-18 NOTE — ED Provider Notes (Signed)
CSN: [id number]     Arrival date & time 11/18/15  1945 History   First MD Initiated Contact with Patient 11/18/15 2108     Chief Complaint  Patient presents with  . Chest Pain     (Consider location/radiation/quality/duration/timing/severity/associated sxs/prior Treatment) HPI  Nichole Best is a 60 year old female, current smoker (45 pack-year hx - states she quit 3 days ago), pmhx of DM, HTN and vertigo, presents emergency Department with substernal, central chest pain with radiation to bilateral arms and hands, described as crushing and squeezing, rated 7/10, occuring intermittently since yesterday afternoon, lasting less than one minute, associated with diaphoresis, nausea, "shaking," dizziness and shortness of breath.  Severe episodes of pain have made her "double over," unable to  Episodes occur while at rest and occurs randomly pt denies changes with exertional, position, inspiration, eating.  She reports a history of heart attack in 2005 (?) although I cannot find this in her chart history, and she does not currently take appropriate meds for post MI.  She denies dyspepsia, abdominal pain, GERD, reflux, vomiting, bloating, bowel changes.  She denies SOB (no SOB when CP free), no wheeze, states she has a mild cough, no productive sputum, no fevers, chills, sweats.  She also reports that she has COPD which is unmanaged per pt.  She denies abdominal pain, flank pain, indigestion, vomiting, diarrhea, dyspepsia, bloating.  She complains that after quitting smoking 3 days ago she began to feel ill.  She also reports eating "cuties" (mandarin oranges) which she thinks she used to be allergic to, and reports she "keeps hoping chest pain is indigestion."  She states she keeps forcing herself to belch hoping to make the pain better however she no improvement.  Past Medical History  Diagnosis Date  . Diabetes mellitus without complication (HCC)   . Hypertension   . Vertigo    History reviewed. No  pertinent past surgical history. No family history on file. Social History  Substance Use Topics  . Smoking status: Current Every Day Smoker -- 0.25 packs/day    Types: Cigarettes  . Smokeless tobacco: None     Comment: Pt has been smoking since age 60  . Alcohol Use: No     Comment: Caffene user   OB History    No data available     Review of Systems  All other systems reviewed and are negative.     Allergies  Codeine and Lisinopril  Home Medications   Prior to Admission medications   Medication Sig Start Date End Date Taking? Authorizing Provider  losartan (COZAAR) 50 MG tablet Take 50 mg by mouth daily.   Yes Historical Provider, MD  meclizine (ANTIVERT) 25 MG tablet Take 1 tablet (25 mg total) by mouth 3 (three) times daily as needed for dizziness. 09/24/15  Yes Vanetta MuldersScott Zackowski, MD  Multiple Vitamin (MULTIVITAMIN WITH MINERALS) TABS tablet Take 1 tablet by mouth daily.   Yes Historical Provider, MD  naproxen sodium (ANAPROX) 220 MG tablet Take 220 mg by mouth 2 (two) times daily as needed (for pain).   Yes Historical Provider, MD   BP 103/90 mmHg  Pulse 84  Temp(Src) 98.8 F (37.1 C)  Resp 15  Ht 5\' 6"  (1.676 m)  Wt 74.588 kg  BMI 26.55 kg/m2  SpO2 98% Physical Exam  Constitutional: She is oriented to person, place, and time. She appears well-developed and well-nourished. No distress.  HENT:  Head: Normocephalic and atraumatic.  Nose: Nose normal.  Mouth/Throat: Oropharynx is clear and  moist. No oropharyngeal exudate.  Eyes: Conjunctivae and EOM are normal. Pupils are equal, round, and reactive to light. Right eye exhibits no discharge. Left eye exhibits no discharge. No scleral icterus.  Neck: Normal range of motion. No JVD present. No tracheal deviation present. No thyromegaly present.  Cardiovascular: Normal rate, regular rhythm, normal heart sounds and intact distal pulses.  Exam reveals no gallop and no friction rub.   No murmur heard. Pulmonary/Chest:  Effort normal and breath sounds normal. No respiratory distress. She has no wheezes. She has no rales. She exhibits no tenderness.  Abdominal: Soft. Bowel sounds are normal. She exhibits no distension and no mass. There is no tenderness. There is no rebound and no guarding.  Musculoskeletal: Normal range of motion. She exhibits no edema or tenderness.  Lymphadenopathy:    She has no cervical adenopathy.  Neurological: She is alert and oriented to person, place, and time. She has normal reflexes. No cranial nerve deficit. She exhibits normal muscle tone. Coordination normal.  Skin: Skin is warm and dry. No rash noted. She is not diaphoretic. No erythema. No pallor.  Psychiatric: She has a normal mood and affect. Her behavior is normal. Judgment and thought content normal.  Nursing note and vitals reviewed.   ED Course  Procedures (including critical care time) Labs Review Labs Reviewed  BASIC METABOLIC PANEL - Abnormal; Notable for the following:    Glucose, Bld 117 (*)    All other components within normal limits  CBC - Abnormal; Notable for the following:    Hemoglobin 11.0 (*)    MCV 75.7 (*)    MCH 22.8 (*)    All other components within normal limits  TROPONIN I  TROPONIN I  TROPONIN I  TROPONIN I  IRON AND TIBC  LIPID PANEL  HEMOGLOBIN A1C  URINE RAPID DRUG SCREEN, HOSP PERFORMED  URINALYSIS, ROUTINE W REFLEX MICROSCOPIC (NOT AT [facility])  Rosezena Sensor, ED    Imaging Review Dg Chest 2 View  11/18/2015  CLINICAL DATA:  Chest pain since yesterday EXAM: CHEST  2 VIEW COMPARISON:  10/03/2008 FINDINGS: Normal heart size and mediastinal contours. Borderline hyperinflation. No acute infiltrate or edema. No effusion or pneumothorax. No acute osseous findings. IMPRESSION: No evidence of active disease. Electronically Signed   By: Marnee Spring M.D.   On: 11/18/2015 21:07   I have personally reviewed and evaluated these images and lab results as part of my medical  decision-making.   EKG Interpretation   Date/Time:  Sunday November 19 2015 00:03:24 EDT Ventricular Rate:  91 PR Interval:  219 QRS Duration: 84 QT Interval:  373 QTC Calculation: 459 R Axis:   38 Text Interpretation:  Sinus rhythm Prolonged PR interval Otherwise no  significant change Confirmed by KNOTT MD, Reuel Boom ([id number]) on 11/19/2015  12:19:12 AM      MDM   10:11 PM Patient is a 60 year old female with medical history of smoker, hypertension, obesity, diabetes, she presents for central chest pain which began yesterday at noon and has been intermittent and severe described as crushing and squeezing.   Heart score:  4 The patient is a high enough risk that she may need a chest pain rule out/OBS admission, however pt states she is going to leave.  While in the exam room she had a "episode" of chest pain where she clutched her chest and adjusted her breathing however she did not become pale or diaphoretic. She is intermittently tachycardic ~111, which she states is from being nervous when  she is in a hospital she often has elevated blood pressures and heart rate. She denies respiratory component of her pain, she denies it pain with inspiration, no wheeze, on exam her lungs are clear to auscultation, did not suspect any pulmonary component at this time, low suspicion for PE.  My plan is to try a GI cocktail and review results with the pt.    12:04 AM  Patient had no relief with GI cocktail, delta troponin was ordered approximately one hour ago, is currently being drawn. Patient continues to have chest pain, including another episode while I was in the exam room.  CP is substernal with radiation to bilateral arms.  Another EKG will be obtained.  Pt states she wants to leave, I have explained to the pt my concerns for cardiac etiology of her pain, and have asked her to stay. EKG has some changes from prior when she was not having active chest pain.  (Heart score now 5-6) Feel with her  significant risk factors she needs to stay for chest pain rule out admission, suspicious for unstable angina. She has expressed multiple times that she wants to leave tonight and suspect she will leave AGAINST MEDICAL ADVICE. The case was discussed with Dr. Clydene Pugh who has reviewed EKGs with me and has personally seen and evaluated the patient, agrees that she needs to be admitted with concerns for cardiac etiology.    After speaking with the pt, she agrees to stay.  Dr. Clydene Pugh requests I page cardiology first - consult entered 12:25 AM  Pt reported to Dr. Clydene Pugh that pain occurs at rest, with radiation to bilateral arm, is worse with exertion, he is also concerned with unstable angina, feels pt may need cath.    12:40 AM Dr. Tarri Glenn will come see the pt, but asks that medicine admit and he will consult, states pt will need stress test, unassigned paged.  0200 AM Dr. Madelyn Flavors to admit for CP r/o Obs.  Dr. Tarri Glenn called after his personal assessment of the pt, who reported zero CP at the time, he advised pt will need stress test - cardiology recommendations as follows, per his documentation and per phone call:  "Would recommend exercise myocardial perfusion scan. If not able to exercise then will use Lexiscan.  Low dose ASA po qd Control HTN Screen for diabetes. Check lipids.  Congratulated her for attempting to quit smoking (last cigarette 3 days ago) Avoid NSAIDS other than low dose ASA"  Patient requested I call her daughter and updated her regarding presentation and admission, I have attempted to call the number provided and the number in the chart multiple times without getting a hold of her.    Final diagnoses:  Chest pain, unspecified chest pain type        Danelle Berry, PA-C 11/19/15 1610  Lyndal Pulley, MD 11/19/15 [phone]

## 2015-11-19 ENCOUNTER — Observation Stay (HOSPITAL_COMMUNITY): Payer: Medicare Other

## 2015-11-19 DIAGNOSIS — R079 Chest pain, unspecified: Secondary | ICD-10-CM

## 2015-11-19 DIAGNOSIS — I10 Essential (primary) hypertension: Secondary | ICD-10-CM

## 2015-11-19 DIAGNOSIS — E119 Type 2 diabetes mellitus without complications: Secondary | ICD-10-CM

## 2015-11-19 DIAGNOSIS — I209 Angina pectoris, unspecified: Secondary | ICD-10-CM | POA: Diagnosis not present

## 2015-11-19 DIAGNOSIS — R0789 Other chest pain: Secondary | ICD-10-CM | POA: Diagnosis not present

## 2015-11-19 DIAGNOSIS — R7303 Prediabetes: Secondary | ICD-10-CM

## 2015-11-19 LAB — NM MYOCAR MULTI W/SPECT W/WALL MOTION / EF
CHL CUP RESTING HR STRESS: 88 {beats}/min
CSEPEDS: 0 s
Estimated workload: 1 METS
Exercise duration (min): 0 min
MPHR: 161 {beats}/min
Peak HR: 131 {beats}/min
Percent HR: 81 %

## 2015-11-19 LAB — LIPID PANEL
CHOLESTEROL: 119 mg/dL (ref 0–200)
HDL: 26 mg/dL — AB (ref >40)
LDL CALC: 75 mg/dL (ref 0–99)
TRIGLYCERIDES: 88 mg/dL (ref <150)
Total CHOL/HDL Ratio: 4.6 RATIO
VLDL: 18 mg/dL (ref 0–40)

## 2015-11-19 LAB — RAPID URINE DRUG SCREEN, HOSP PERFORMED
AMPHETAMINES: NOT DETECTED
BARBITURATES: NOT DETECTED
BENZODIAZEPINES: NOT DETECTED
Cocaine: NOT DETECTED
Opiates: NOT DETECTED
Tetrahydrocannabinol: NOT DETECTED

## 2015-11-19 LAB — URINE MICROSCOPIC-ADD ON: RBC / HPF: NONE SEEN RBC/hpf (ref 0–5)

## 2015-11-19 LAB — URINALYSIS, ROUTINE W REFLEX MICROSCOPIC
BILIRUBIN URINE: NEGATIVE
Glucose, UA: NEGATIVE mg/dL
HGB URINE DIPSTICK: NEGATIVE
Ketones, ur: NEGATIVE mg/dL
NITRITE: NEGATIVE
PROTEIN: NEGATIVE mg/dL
SPECIFIC GRAVITY, URINE: 1.007 (ref 1.005–1.030)
pH: 6.5 (ref 5.0–8.0)

## 2015-11-19 LAB — TROPONIN I: Troponin I: <0.03 ng/mL (ref <0.031)

## 2015-11-19 LAB — IRON AND TIBC
Iron: 71 ug/dL (ref 28–170)
SATURATION RATIOS: 27 % (ref 10.4–31.8)
TIBC: 260 ug/dL (ref 250–450)
UIBC: 189 ug/dL

## 2015-11-19 LAB — I-STAT TROPONIN, ED: Troponin i, poc: 0.01 ng/mL (ref 0.00–0.08)

## 2015-11-19 MED ORDER — GI COCKTAIL ~~LOC~~: 30.0000 mL | Freq: Four times a day (QID) | ORAL | Status: DC | PRN

## 2015-11-19 MED ORDER — ENOXAPARIN SODIUM 40 MG/0.4ML ~~LOC~~ SOLN
40.0000 mg | Freq: Every day | SUBCUTANEOUS | Status: DC
Start: 2015-11-19 — End: 2015-11-19
  Filled 2015-11-19: qty 0.4

## 2015-11-19 MED ORDER — REGADENOSON 0.4 MG/5ML IV SOLN
INTRAVENOUS | Status: AC
  Filled 2015-11-19: qty 5

## 2015-11-19 MED ORDER — MORPHINE SULFATE (PF) 4 MG/ML IV SOLN: 4.0000 mg | Freq: Once | INTRAVENOUS | Status: DC

## 2015-11-19 MED ORDER — MORPHINE SULFATE (PF) 2 MG/ML IV SOLN: 2.0000 mg | INTRAVENOUS | Status: DC | PRN

## 2015-11-19 MED ORDER — ALBUTEROL SULFATE (2.5 MG/3ML) 0.083% IN NEBU: 2.5000 mg | INHALATION_SOLUTION | Freq: Four times a day (QID) | RESPIRATORY_TRACT | Status: AC | PRN

## 2015-11-19 MED ORDER — ADULT MULTIVITAMIN W/MINERALS CH: 1.0000 | ORAL_TABLET | Freq: Every day | ORAL | Status: DC

## 2015-11-19 MED ORDER — ASPIRIN 81 MG PO CHEW
324.0000 mg | CHEWABLE_TABLET | Freq: Once | ORAL | Status: AC
  Administered 2015-11-19: 324 mg via ORAL
  Filled 2015-11-19: qty 4

## 2015-11-19 MED ORDER — TECHNETIUM TC 99M SESTAMIBI - CARDIOLITE
30.0000 | Freq: Once | INTRAVENOUS | Status: AC | PRN
  Administered 2015-11-19: 30 via INTRAVENOUS

## 2015-11-19 MED ORDER — REGADENOSON 0.4 MG/5ML IV SOLN
0.4000 mg | Freq: Once | INTRAVENOUS | Status: AC
  Administered 2015-11-19: 0.4 mg via INTRAVENOUS
  Filled 2015-11-19: qty 5

## 2015-11-19 MED ORDER — ONDANSETRON HCL 4 MG/2ML IJ SOLN: 4.0000 mg | Freq: Three times a day (TID) | INTRAMUSCULAR | Status: AC | PRN

## 2015-11-19 MED ORDER — TECHNETIUM TC 99M SESTAMIBI GENERIC - CARDIOLITE
10.0000 | Freq: Once | INTRAVENOUS | Status: AC | PRN
  Administered 2015-11-19: 10 via INTRAVENOUS

## 2015-11-19 MED ORDER — LOSARTAN POTASSIUM 50 MG PO TABS
50.0000 mg | ORAL_TABLET | Freq: Every day | ORAL | Status: DC
  Filled 2015-11-19: qty 1

## 2015-11-19 MED ORDER — NITROGLYCERIN 0.4 MG SL SUBL: 0.4000 mg | SUBLINGUAL_TABLET | SUBLINGUAL | Status: DC | PRN

## 2015-11-19 MED ORDER — ACETAMINOPHEN 325 MG PO TABS: 650.0000 mg | ORAL_TABLET | ORAL | Status: DC | PRN

## 2015-11-19 NOTE — H&P (Addendum)
History and Physical    Nichole Best WUJ:[id number] DOB: 1956-01-12 DOA: 11/18/2015  Referring MD/NP/PA: Sheliah Mends PA PCP: No PCP Per Patient  Outpatient Specialists:None Patient coming from: home  Chief Complaint: Chest pain  HPI: Nichole Best is a 60 y.o. female with medical history significant of HTN, diabetes mellitus type 2, osteoarthritis, tobacco abuse, and myocardial infarction in 2005; presents with complaints of crushing chest pain that has waxed and waned over the last 3 days. Reports feeling like a elephant was resting on her chest. Symptoms have occurred at rest and are worse on exertion. She notes that one episode occurred while tossing the football with grade kids. Associated symptoms included shortness of breath and diaphoresis. She did not try anything for the symptoms except for staying still waiting for them to self resolve. She denies any lower extremity swelling, weight gain, palpitations, fever, chills, abdominal pain, or diarrhea. Previously, reports having a MI back in 2005 when she lived in [place]. She noted she had a positive stress test and was placed on a medication that she thinks was Plavix for some time. Reports that she was just recently told that she likely had diabetes at a church fair. Upon review of records it appears that her hemoglobin A1c was noted to be 6.6 back in 2015. Currently she is not on any medications for treatment of diabetes. Endorses smoking cigarettes from age of 15 up until approximately 3 days ago on average smoking half pack cigarettes per day. Denies any significant family history of heart disease starting in any family member younger than 52.  ED Course: Upon admission into the emergency department patient was seen to have tachycardia heart rate support to 111, but all other vitals within normal limits. Lab work was relatively unremarkable with 2 sets of troponins seen to be negative. EKG showed ST sinus rhythm with prolonged PR interval and  subtle ST changes.  Review of Systems: As per HPI otherwise 10 point review of systems negative.    Past Medical History  Diagnosis Date  . Diabetes mellitus without complication (HCC)   . Hypertension   . Vertigo     History reviewed. No pertinent past surgical history.   reports that she has been smoking Cigarettes.  She has been smoking about 0.25 packs per day. She does not have any smokeless tobacco history on file. She reports that she does not drink alcohol. Her drug history is not on file.  Allergies  Allergen Reactions  . Codeine Other (See Comments)    seizures  . Lisinopril Cough    No family history on file.  Prior to Admission medications   Medication Sig Start Date End Date Taking? Authorizing Provider  losartan (COZAAR) 50 MG tablet Take 50 mg by mouth daily.   Yes Historical Provider, MD  meclizine (ANTIVERT) 25 MG tablet Take 1 tablet (25 mg total) by mouth 3 (three) times daily as needed for dizziness. 09/24/15  Yes Vanetta Mulders, MD  Multiple Vitamin (MULTIVITAMIN WITH MINERALS) TABS tablet Take 1 tablet by mouth daily.   Yes Historical Provider, MD  naproxen sodium (ANAPROX) 220 MG tablet Take 220 mg by mouth 2 (two) times daily as needed (for pain).   Yes Historical Provider, MD    Physical Exam: Filed Vitals:   11/18/15 2315 11/18/15 2330 11/18/15 2345 11/19/15 0000  BP: 129/74 124/68 125/76 103/90  Pulse: 83 72 67 84  Temp:      Resp: Height:  Weight:      SpO2: 97% 98% 99% 98%      Constitutional: NAD, calm, comfortable Filed Vitals:   11/18/15 2315 11/18/15 2330 11/18/15 2345 11/19/15 0000  BP: 129/74 124/68 125/76 103/90  Pulse: 83 72 67 84  Temp:      Resp: 23 20 19 15   Height:      Weight:      SpO2: 97% 98% 99% 98%   Eyes: PERRL, lids and conjunctivae normal ENMT: Mucous membranes are moist. Posterior pharynx clear of any exudate or lesions.Normal dentition.  Neck: normal, supple, no masses, no  thyromegaly Respiratory: clear to auscultation bilaterally, no wheezing, no crackles. Normal respiratory effort. No accessory muscle use.  Cardiovascular: Regular rate and rhythm, no murmurs / rubs / gallops. No extremity edema. 2+ pedal pulses. No carotid bruits.  Abdomen: no tenderness, no masses palpated. No hepatosplenomegaly. Bowel sounds positive.  Musculoskeletal: no clubbing / cyanosis. No joint deformity upper and lower extremities. Good ROM, no contractures. Normal muscle tone.  Skin: no rashes, lesions, ulcers. No induration Neurologic: CN 2-12 grossly intact. Sensation intact, DTR normal. Strength 5/5 in all 4.  Psychiatric: Normal judgment and insight. Alert and oriented x 3. Normal mood.    Labs on Admission: I have personally reviewed following labs and imaging studies  CBC:  Recent Labs Lab 11/18/15 2020  WBC 8.3  HGB 11.0*  HCT 36.5  MCV 75.7*  PLT 229   Basic Metabolic Panel:  Recent Labs Lab 11/18/15 2020  NA 140  K 4.0  CL 104  CO2 24  GLUCOSE 117*  BUN 12  CREATININE 0.75  CALCIUM 9.4   GFR: Estimated Creatinine Clearance: 78.2 mL/min (by C-G formula based on Cr of 0.75). Liver Function Tests: No results for input(s): AST, ALT, ALKPHOS, BILITOT, PROT, ALBUMIN in the last 168 hours. No results for input(s): LIPASE, AMYLASE in the last 168 hours. No results for input(s): AMMONIA in the last 168 hours. Coagulation Profile: No results for input(s): INR, PROTIME in the last 168 hours. Cardiac Enzymes:  Recent Labs Lab 11/18/15 2020  TROPONINI <0.03   BNP (last 3 results) No results for input(s): PROBNP in the last 8760 hours. HbA1C: No results for input(s): HGBA1C in the last 72 hours. CBG: No results for input(s): GLUCAP in the last 168 hours. Lipid Profile: No results for input(s): CHOL, HDL, LDLCALC, TRIG, CHOLHDL, LDLDIRECT in the last 72 hours. Thyroid Function Tests: No results for input(s): TSH, T4TOTAL, FREET4, T3FREE, THYROIDAB in  the last 72 hours. Anemia Panel: No results for input(s): VITAMINB12, FOLATE, FERRITIN, TIBC, IRON, RETICCTPCT in the last 72 hours. Urine analysis:    Component Value Date/Time   COLORURINE YELLOW 09/24/2015 1640   APPEARANCEUR CLEAR 09/24/2015 1640   LABSPEC 1.020 09/24/2015 1640   PHURINE 5.5 09/24/2015 1640   GLUCOSEU NEGATIVE 09/24/2015 1640   HGBUR NEGATIVE 09/24/2015 1640   BILIRUBINUR NEGATIVE 09/24/2015 1640   KETONESUR NEGATIVE 09/24/2015 1640   PROTEINUR NEGATIVE 09/24/2015 1640   UROBILINOGEN 0.2 12/17/2013 1546   NITRITE NEGATIVE 09/24/2015 1640   LEUKOCYTESUR TRACE* 09/24/2015 1640   Sepsis Labs: No results found for this or any previous visit (from the past 240 hour(s)).   Radiological Exams on Admission: Dg Chest 2 View  11/18/2015  CLINICAL DATA:  Chest pain since yesterday EXAM: CHEST  2 VIEW COMPARISON:  10/03/2008 FINDINGS: Normal heart size and mediastinal contours. Borderline hyperinflation. No acute infiltrate or edema. No effusion or pneumothorax. No acute osseous findings. IMPRESSION:  No evidence of active disease. Electronically Signed   By: Marnee Spring M.D.   On: 11/18/2015 21:07    EKG: Independently reviewed. Sinus rhythm with prolonged PR interval  Assessment/Plan Chest pain: Acute. Patient with risk factors including hypertension, tobacco abuse, diabetes mellitus type 2, and previously reported MI.General and stable angina- Admit to a telemetry bed - Trend troponins - Repeat EKG at 6 AM - Nitroglycerin prn CP  - Echocardiogram  - Cardiology to see to determine if patient warrants a stress test in a.m.  First degree AV block: PR interval greater than .2 seconds on ekg  -  Continue to monitor   Essential hypertension - Continue losartan  Diabetes mellitus type 2: Patient not aware of this diagnosis until recently and is currently not on any medications for this. Review of records show a hemoglobin A1c of 6.6 back in 2015. - Check a  hemoglobin A1c - Continue to monitor place on sliding scale insulin if seen to be elevated  DVT prophylaxis: Lovenox Code Status: Full Family Communication: None  Disposition Plan: Possible discharge home in 1-2 days Consults called: Dr. Tarri Glenn cardiology Admission status: Observation telemetry   Clydie Braun MD [facility] Pager [phone]  If 7PM-7AM, please contact night-coverage [email or web address] Password [email or web address]  11/19/2015, 2:23 AM

## 2015-11-19 NOTE — Progress Notes (Signed)
Patient Profile: 60 yo AA woman, extensive smoking history (45 PY, states quit 3 days ago) and HTN who presented with CP. Seen by fellow overnight. Ruled out for MI with negative enzymes x 3.   Subjective: Currently CP free. Only complaint is long wait in the ED. She is getting antsy and ready to go home. Patient agrees to stay for stress test.   Objective: Vital signs in last 24 hours: Temp:  [98.8 F (37.1 C)] 98.8 F (37.1 C) (04/29 1949) Pulse Rate:  [67-111] 80 (04/30 0715) Resp:  [15-25] 18 (04/30 0715) BP: (103-137)/(57-90) 107/77 mmHg (04/30 0715) SpO2:  [95 %-100 %] 95 % (04/30 0715) Weight:  [164 lb 7 oz (74.588 kg)] 164 lb 7 oz (74.588 kg) (04/29 1954)    Intake/Output from previous day: 04/29 0701 - 04/30 0700 In: 1000 [I.V.:1000] Out: -  Intake/Output this shift:    Medications Current Facility-Administered Medications  Medication Dose Route Frequency Provider Last Rate Last Dose  . acetaminophen (TYLENOL) tablet 650 mg  650 mg Oral Q4H PRN Rondell A Katrinka BlazingSmith, MD      . albuterol (PROVENTIL) (2.5 MG/3ML) 0.083% nebulizer solution 2.5 mg  2.5 mg Nebulization Q6H PRN Danelle BerryLeisa Tapia, PA-C      . enoxaparin (LOVENOX) injection 40 mg  40 mg Subcutaneous Daily Rondell A Katrinka BlazingSmith, MD      . gi cocktail (Maalox,Lidocaine,Donnatal)  30 mL Oral QID PRN Clydie Braunondell A Smith, MD      . losartan (COZAAR) tablet 50 mg  50 mg Oral Daily Rondell A Katrinka BlazingSmith, MD      . morphine 2 MG/ML injection 2 mg  2 mg Intravenous Q2H PRN Clydie Braunondell A Smith, MD      . morphine 4 MG/ML injection 4 mg  4 mg Intravenous Once Danelle BerryLeisa Tapia, PA-C   Stopped at 11/19/15 0133  . multivitamin with minerals tablet 1 tablet  1 tablet Oral Daily Rondell A Katrinka BlazingSmith, MD      . nitroGLYCERIN (NITROSTAT) SL tablet 0.4 mg  0.4 mg Sublingual Q5 min PRN Clydie Braunondell A Smith, MD      . ondansetron (ZOFRAN) injection 4 mg  4 mg Intravenous Q8H PRN Danelle BerryLeisa Tapia, PA-C       Current Outpatient Prescriptions  Medication Sig Dispense Refill  .  losartan (COZAAR) 50 MG tablet Take 50 mg by mouth daily.    . meclizine (ANTIVERT) 25 MG tablet Take 1 tablet (25 mg total) by mouth 3 (three) times daily as needed for dizziness. 30 tablet 0  . Multiple Vitamin (MULTIVITAMIN WITH MINERALS) TABS tablet Take 1 tablet by mouth daily.    . naproxen sodium (ANAPROX) 220 MG tablet Take 220 mg by mouth 2 (two) times daily as needed (for pain).      PE: General appearance: alert, cooperative and no distress Neck: no carotid bruit and no JVD Lungs: clear to auscultation bilaterally Heart: regular rate and rhythm, S1, S2 normal, no murmur, click, rub or gallop Extremities: no LEE Pulses: 2+ and symmetric Skin: warm and dry Neurologic: Grossly normal  Lab Results:   Recent Labs  11/18/15 2020  WBC 8.3  HGB 11.0*  HCT 36.5  PLT 229   BMET  Recent Labs  11/18/15 2020  NA 140  K 4.0  CL 104  CO2 24  GLUCOSE 117*  BUN 12  CREATININE 0.75  CALCIUM 9.4   PT/INR No results for input(s): LABPROT, INR in the last 72 hours. Cholesterol  Recent Labs  11/19/15  0522  CHOL 119   Cardiac Panel (last 3 results)  Recent Labs  11/19/15 0253 11/19/15 0522 11/19/15 0830  TROPONINI <0.03 <0.03 <0.03    Studies/Results: NST - pending   Assessment/Plan    Principal Problem:   Chest pain at rest Active Problems:   Essential hypertension   Diabetes mellitus type 2 in nonobese (HCC)   1. Chest Pain with moderate risk for cardiac etiology: cardiac enzymes are negative x 3. Patient agrees to exercise NST to assess for ischemia. Will arrange for today. We will f/u on results. Possible d/c home later today if negative for ischemia.   2. HTN: BP is currently well controlled. Continue losartan.   3. Tobacco Use: patient reports that she quit smoking 3 days ago.   4. Lipids: lipid panel shows LDL of 75 mg/dL. Currently not on a statin. Further management dependent on ischemic w/u. If negative w/u, can control with lifestyle  modification through diet and exercise. If w/u suggestive of CAD, would recommend addition of statin medication.   5. Diabetes Screening: no documented history of DM. Hgb A1c pending.   Md Smola M. Sharol Harness, PA-C 11/19/2015 10:00 AM

## 2015-11-19 NOTE — Consult Note (Signed)
Referring Physician: Danelle BerryLeisa Tapia, PA -- ER   Reason for Consultation: CP  HPI: 60 yo AA woman, extensive smoking history (45 PY, states quit 3 days ago) and HTN presented with CP. Started 3 days ago, intermittent, located in the substernal region, radiating across the chest and arms bilaterally, felt crushing and squeezing, moderate to severe intensity, lasting few minutes each time.  Triggered by ADLs but sometimes at rest. Associated with diaphoresis. Denies syncope, fever, N/V, orthopnea, PND, edema. Reports having a exercise stress test in 2005 but no history of cath or heart surgery.   ECG in ER negative for ischemia. TnI x2 negative. Vitals are stable. At present CP free.    Review of Systems:  10 systems reviewed unremarkable except as noted in HPI    Past Medical History  Diagnosis Date  . Diabetes mellitus without complication (HCC)   . Hypertension   . Vertigo    No current facility-administered medications on file prior to encounter.   Current Outpatient Prescriptions on File Prior to Encounter  Medication Sig Dispense Refill  . losartan (COZAAR) 50 MG tablet Take 50 mg by mouth daily.    . meclizine (ANTIVERT) 25 MG tablet Take 1 tablet (25 mg total) by mouth 3 (three) times daily as needed for dizziness. 30 tablet 0  . naproxen sodium (ANAPROX) 220 MG tablet Take 220 mg by mouth 2 (two) times daily as needed (for pain).        (Not in a hospital admission)   . enoxaparin (LOVENOX) injection  40 mg Subcutaneous Q24H  . losartan  50 mg Oral Daily  . morphine  4 mg Intravenous Once  . multivitamin with minerals  1 tablet Oral Daily    Infusions:    Allergies  Allergen Reactions  . Codeine Other (See Comments)    seizures  . Lisinopril Cough    Social History   Social History  . Marital Status: Legally Separated    Spouse Name: N/A  . Number of Children: N/A  . Years of Education: N/A   Occupational History  . Not on file.   Social History  Main Topics  . Smoking status: Current Every Day Smoker -- 0.25 packs/day    Types: Cigarettes  . Smokeless tobacco: Not on file     Comment: Pt has been smoking since age 60  . Alcohol Use: No     Comment: Caffene user  . Drug Use: Not on file  . Sexual Activity: Not on file   Other Topics Concern  . Not on file   Social History Narrative    No family history on file.  PHYSICAL EXAM: Filed Vitals:   11/18/15 2345 11/19/15 0000  BP: 125/76 103/90  Pulse: 67 84  Temp:    Resp: 19 15     Intake/Output Summary (Last 24 hours) at 11/19/15 0225 Last data filed at 11/19/15 0148  Gross per 24 hour  Intake   1000 ml  Output      0 ml  Net   1000 ml    General:  Well appearing. No respiratory difficulty HEENT: normal Neck: supple. no JVD. Carotids 2+ bilat; no bruits. No lymphadenopathy or thryomegaly appreciated. Cor: PMI nondisplaced. Regular rate & rhythm. No rubs, gallops or murmurs. Lungs: clear Abdomen: soft, nontender, nondistended. No hepatosplenomegaly. No bruits or masses. Good bowel sounds. Extremities: no cyanosis, clubbing, rash, edema Neuro: alert & oriented x 3, cranial nerves grossly intact. moves all 4 extremities w/o difficulty.  Affect pleasant.  ECG: NSR, no ischemic changes  Results for orders placed or performed during the hospital encounter of 11/18/15 (from the past 24 hour(s))  Basic metabolic panel     Status: Abnormal   Collection Time: 11/18/15  8:20 PM  Result Value Ref Range   Sodium 140 135 - 145 mmol/L   Potassium 4.0 3.5 - 5.1 mmol/L   Chloride 104 101 - 111 mmol/L   CO2 24 22 - 32 mmol/L   Glucose, Bld 117 (H) 65 - 99 mg/dL   BUN 12 6 - 20 mg/dL   Creatinine, Ser 5.36 0.44 - 1.00 mg/dL   Calcium 9.4 8.9 - 64.4 mg/dL   GFR calc non Af Amer >60 >60 mL/min   GFR calc Af Amer >60 >60 mL/min   Anion gap 12 5 - 15  CBC     Status: Abnormal   Collection Time: 11/18/15  8:20 PM  Result Value Ref Range   WBC 8.3 4.0 - 10.5 K/uL   RBC  4.82 3.87 - 5.11 MIL/uL   Hemoglobin 11.0 (L) 12.0 - 15.0 g/dL   HCT 03.4 74.2 - 59.5 %   MCV 75.7 (L) 78.0 - 100.0 fL   MCH 22.8 (L) 26.0 - 34.0 pg   MCHC 30.1 30.0 - 36.0 g/dL   RDW 63.8 75.6 - 43.3 %   Platelets 229 150 - 400 K/uL  Troponin I     Status: None   Collection Time: 11/18/15  8:20 PM  Result Value Ref Range   Troponin I <0.03 <0.031 ng/mL  I-stat troponin, ED     Status: None   Collection Time: 11/19/15 12:11 AM  Result Value Ref Range   Troponin i, poc 0.01 0.00 - 0.08 ng/mL   Comment 3           Dg Chest 2 View  11/18/2015  CLINICAL DATA:  Chest pain since yesterday EXAM: CHEST  2 VIEW COMPARISON:  10/03/2008 FINDINGS: Normal heart size and mediastinal contours. Borderline hyperinflation. No acute infiltrate or edema. No effusion or pneumothorax. No acute osseous findings. IMPRESSION: No evidence of active disease. Electronically Signed   By: Marnee Spring M.D.   On: 11/18/2015 21:07     ASSESSMENT:  1. Angina pectoris - MI ruled out - CAD risk factors: HTN, smoking  PLAN/DISCUSSION:  Would recommend exercise myocardial perfusion scan. If not able to exercise then will use Lexiscan.  Low dose ASA po qd Control HTN Screen for diabetes. Check lipids.  Congratulated her for attempting to quit smoking (last cigarette 3 days ago) Avoid NSAIDS other than low dose ASA  Cardiology will follow. Discussed with ER provider.     Nevin Bloodgood, MD Cardiology

## 2015-11-19 NOTE — Progress Notes (Signed)
Pt seen and examined at bedside, admitted for evaluation of CP. Since pt admitted after midnight, please see earlier admission note by Dr. Katrinka BlazingSmith. Cardiology team consulted, plan for stress test today, possible d/c home later today if test negative.   Debbora PrestoMAGICK-Tally Mattox, MD  [facility] Pager [phone]  If 7PM-7AM, please contact night-coverage [email or web address] Password [email or web address]

## 2015-11-19 NOTE — Progress Notes (Signed)
Pre treadmill test VS

## 2015-11-19 NOTE — ED Notes (Signed)
Pt to stress test

## 2015-11-19 NOTE — Progress Notes (Signed)
    Reassuring nuclear stress test with no ischemia, low risk. I reviewed consult note from Dr. Tarri GlennAzeem.  Agree with discharge. No cardiology follow-up necessary.  Continue to avoid smoking, quit 3 days ago. Troponin normal.  Donato SchultzSKAINS, MARK, MD

## 2015-11-19 NOTE — Progress Notes (Signed)
7 mins, Hao PA at bedside, pt denies CP/SOB Testing has ended

## 2015-11-19 NOTE — Progress Notes (Signed)
1 min, Hao PA at bedside, pt reports Chest pressure and SOB

## 2015-11-19 NOTE — ED Notes (Signed)
Attempted report to floor.  Pt stress test appears normal and per physician note, pt to poss be dc'd home today if that is the result.  Will check with hospitalist to see if dc possible and if so, will dc from ED.

## 2015-11-19 NOTE — Progress Notes (Signed)
Pt unable to complete treadmill test due to artifact on EKG reading, switched to a LexiScan per [place] PA request    Victorino DikeLeslie Ryheem Jay RN

## 2015-11-19 NOTE — ED Notes (Signed)
Returned from stress test.

## 2015-11-19 NOTE — Discharge Instructions (Signed)

## 2015-11-19 NOTE — Progress Notes (Signed)
3 mins, Hao PA at bedside, pt continues to report chest pressure, reports SOB has "gone away."

## 2015-11-19 NOTE — ED Provider Notes (Signed)
Medical screening examination/treatment/procedure(s) were performed by non-physician practitioner and as supervising physician I was immediately available for consultation/collaboration.   EKG Interpretation   Date/Time:  Saturday November 18 2015 19:51:08 EDT Ventricular Rate:  105 PR Interval:  166 QRS Duration: 80 QT Interval:  324 QTC Calculation: 428 R Axis:   34 Text Interpretation:  Sinus tachycardia Otherwise normal ECG No previous  tracing Confirmed by Cicilia Clinger MD, Hendrixx Severin ([id number]) on 11/19/2015 12:07:59 AM    EKG Interpretation  Date/Time:  Sunday November 19 2015 00:03:24 EDT Ventricular Rate:  91 PR Interval:  219 QRS Duration: 84 QT Interval:  373 QTC Calculation: 459 R Axis:   38 Text Interpretation:  Sinus rhythm Prolonged PR interval Otherwise no significant change Confirmed by Malynn Lucy MD, Bertran Zeimet ([id number]) on 11/19/2015 12:19:12 AM        59  y.o. female presents with multiple features concerning for unstable angina. High risk, heart score 6 with increasing severity and frequency of typical pain. Will require admission for r/o. Cards consult and admit to medicine.   Lyndal Pulleyaniel Caleah Tortorelli, MD 11/19/15 [phone]

## 2015-11-19 NOTE — Progress Notes (Signed)
5 mins, Hao PA at bedside. Pt denies any symptoms at this time. States chest pressure has subsided

## 2015-11-19 NOTE — Discharge Summary (Signed)
Physician Discharge Summary  Nichole PinesBrenda Lines JXB:[id number] DOB: 1955-09-08 DOA: 11/18/2015  PCP: No PCP Per Patient  Admit date: 11/18/2015 Discharge date: 11/19/2015  Recommendations for Outpatient Follow-up:  1. Pt will need to follow up with PCP in 1-2 weeks post discharge 2. Please obtain BMP to evaluate electrolytes and kidney function 3. Please also check CBC to evaluate Hg and Hct levels  Discharge Diagnoses:  Principal Problem:   Chest pain at rest Active Problems:   Essential hypertension   Diabetes mellitus type 2 in nonobese [facility](HCC)  Discharge Condition: Stable  Diet recommendation: Heart healthy diet discussed in details   History of present illness:  60 yo AA woman, with known extensive smoking history (45 PY, states quit 3 days ago) and HTN who presented with CP. Cardiology consulted for assistance.   Hospital Course:  Principal Problem:   Chest pain at rest - Reassuring nuclear stress test with no ischemia, low risk. - cardiology team has cleared pt for discharge - pt wants to go home  Active Problems:   Essential hypertension - stable, continue home regimen     Diabetes mellitus type 2 in nonobese (HCC) - stable, diet controlled  - A1C 6.4  Procedures/Studies: Dg Chest 2 View 11/18/2015  No evidence of active disease.  Nm Myocar Multi W/spect W/wall Motion / Ef 11/19/2015  No reversible ischemia or infarction. Mild distal anterior wall and apical thinning. 2. Normal left ventricular wall motion. 3. Left ventricular ejection fraction 60% 4. Low-risk stress test findings  Discharge Exam: Filed Vitals:   11/19/15 1600 11/19/15 1630  BP: 145/82 132/71  Pulse: 86 86  Temp:    Resp:  16   Filed Vitals:   11/19/15 1500 11/19/15 1530 11/19/15 1600 11/19/15 1630  BP: 132/80 144/88 145/82 132/71  Pulse: 79 80 86 86  Temp:      Resp: 14   16  Height:      Weight:      SpO2: 99% 97% 99% 99%    General: Pt is alert, follows commands appropriately, not in  acute distress Cardiovascular: Regular rate and rhythm, no rubs, no gallops Respiratory: Clear to auscultation bilaterally, no wheezing, no crackles, no rhonchi Abdominal: Soft, non tender, non distended, bowel sounds +, no guarding Extremities: no edema, no cyanosis, pulses palpable bilaterally DP and PT Neuro: Grossly nonfocal  Discharge Instructions  Discharge Instructions    Diet - low sodium heart healthy    Complete by:  As directed      Increase activity slowly    Complete by:  As directed             Medication List    TAKE these medications        losartan 50 MG tablet  Commonly known as:  COZAAR  Take 50 mg by mouth daily.     meclizine 25 MG tablet  Commonly known as:  ANTIVERT  Take 1 tablet (25 mg total) by mouth 3 (three) times daily as needed for dizziness.     multivitamin with minerals Tabs tablet  Take 1 tablet by mouth daily.     naproxen sodium 220 MG tablet  Commonly known as:  ANAPROX  Take 220 mg by mouth 2 (two) times daily as needed (for pain).           Follow-up Information    [facility] [address] [facility].   Specialty:  Cardiology   Why:  cardiology office will arrange outpatient followup, please  give [place] a call if you do not hear from [place] in 3 business days   Contact information:   [address], [address] [place] [address] [phone]       The results of significant diagnostics from this hospitalization (including imaging, microbiology, ancillary and laboratory) are listed below for reference.     Microbiology: No results found for this or any previous visit (from the past 240 hour(s)).   Labs: Basic Metabolic Panel:  Recent Labs Lab 11/18/15 2020  NA 140  K 4.0  CL 104  CO2 24  GLUCOSE 117*  BUN 12  CREATININE 0.75  CALCIUM 9.4   CBC:  Recent Labs Lab 11/18/15 2020  WBC 8.3  HGB 11.0*  HCT 36.5  MCV 75.7*  PLT 229   Cardiac Enzymes:  Recent Labs Lab  11/18/15 2020 11/19/15 0253 11/19/15 0522 11/19/15 0830  TROPONINI <0.03 <0.03 <0.03 <0.03   SIGNED: Time coordinating discharge: 30 minutes  Debbora Presto, MD  [facility] 11/19/2015, 4:58 PM Pager [phone]  If 7PM-7AM, please contact night-coverage [email or web address] Password [email or web address]

## 2015-11-20 LAB — HEMOGLOBIN A1C
Hgb A1c MFr Bld: 6.4 % — ABNORMAL HIGH (ref 4.8–5.6)
MEAN PLASMA GLUCOSE: 137 mg/dL

## 2015-11-28 NOTE — Progress Notes (Signed)
Patient ID: Nichole Best, female   DOB: 10/09/1955, 60 y.o.   MRN: [id number]     Cardiology Office Note   Date:  11/28/2015   ID:  Nichole Best, DOB April 06, 1956, MRN [id number]  PCP:  No PCP Per Patient  Cardiologist:   Anne Fu  No chief complaint on file.     History of Present Illness: [name] is a 60 y.o. female who presents for post hospital f/u Seen by Dr Anne Fu 11/19/15 has no primary care doctor. Admitted with atypical chest pain. URI cough Long term smoker with HTN and DM.  She r/o CXR with NAD.  Reviewed her myovue which was normal with no ischemia and normal EF.  Since d/c has had some vetigo And dizzyness on arising in am. No focal neuro signs Does not have primary and has not taken ARB or Antivert  Trying to get in with Dr Leola Brazil on [facility].  No chest Pain since d/c Mild exertional dyspnea      Past Medical History  Diagnosis Date  . Diabetes mellitus without complication (HCC)   . Hypertension   . Vertigo     No past surgical history on file.   Current Outpatient Prescriptions  Medication Sig Dispense Refill  . losartan (COZAAR) 50 MG tablet Take 50 mg by mouth daily.    . meclizine (ANTIVERT) 25 MG tablet Take 1 tablet (25 mg total) by mouth 3 (three) times daily as needed for dizziness. 30 tablet 0  . Multiple Vitamin (MULTIVITAMIN WITH MINERALS) TABS tablet Take 1 tablet by mouth daily.    . naproxen sodium (ANAPROX) 220 MG tablet Take 220 mg by mouth 2 (two) times daily as needed (for pain).     No current facility-administered medications for this visit.    Allergies:   Codeine and Lisinopril    Social History:  The patient  reports that she has been smoking Cigarettes.  She has been smoking about 0.25 packs per day. She does not have any smokeless tobacco history on file. She reports that she does not drink alcohol.   Family History:  The patient's family history is not on file.    ROS:  Please see the history of present illness.    Otherwise, review of systems are positive for none.   All other systems are reviewed and negative.    PHYSICAL EXAM: VS:  There were no vitals taken for this visit. , BMI There is no weight on file to calculate BMI. Affect appropriate Healthy:  appears stated age HEENT: normal Neck supple with no adenopathy JVP normal no bruits no thyromegaly Lungs clear with no wheezing and good diaphragmatic motion Heart:  S1/S2 no murmur, no rub, gallop or click PMI normal Abdomen: benighn, BS positve, no tenderness, no AAA no bruit.  No HSM or HJR Distal pulses intact with no bruits No edema Neuro non-focal Skin warm and dry No muscular weakness    EKG:  SR PR 219 otherwise normal   Recent Labs: 09/24/2015: ALT 14 11/18/2015: BUN 12; Creatinine, Ser 0.75; Hemoglobin 11.0*; Platelets 229; Potassium 4.0; Sodium 140    Lipid Panel    Component Value Date/Time   CHOL 119 11/19/2015 0522   TRIG 88 11/19/2015 0522   HDL 26* 11/19/2015 0522   CHOLHDL 4.6 11/19/2015 0522   VLDL 18 11/19/2015 0522   LDLCALC 75 11/19/2015 0522      Wt Readings from Last 3 Encounters:  11/18/15 74.588 kg (164 lb 7 oz)  12/17/13 81.647  kg (180 lb)      Other studies Reviewed: Additional studies/ records that were reviewed today include: Hospital notes, D/C summary ECG , labs CXR and nuclear study .    ASSESSMENT AND PLAN:  1.  Chest pain:  Resolved normal nuclear study observe 2. HTN:  Called ARB in for patient until she can establish with primary 3. Smoking :  Discussed cessation no active wheezing CXR in hospital ok 4. DM:  Discussed low carb diet.  Target hemoglobin A1c is 6.5 or less.  Continue current medications. 5. Vertigo antivert called in f/u primary    Current medicines are reviewed at length with the patient today.  The patient does not have concerns regarding medicines.  The following changes have been made:  no change  Labs/ tests ordered today include: None   No orders of the  defined types were placed in this encounter.     Disposition:   FU with Dr Anne FuSkains in 6 months      Signed, Charlton HawsPeter Carmeline Kowal, MD  11/28/2015 7:20 PM    [facility] [address], [place], [place]  [id number] Phone: [phone]; Fax: [phone]

## 2015-11-30 ENCOUNTER — Ambulatory Visit (INDEPENDENT_AMBULATORY_CARE_PROVIDER_SITE_OTHER): Payer: Medicare Other | Admitting: Cardiovascular Disease

## 2015-11-30 ENCOUNTER — Encounter: Payer: Self-pay | Admitting: Cardiovascular Disease

## 2015-11-30 VITALS — BP 118/80 | HR 102 | Ht 66.0 in | Wt 165.0 lb

## 2015-11-30 DIAGNOSIS — R0789 Other chest pain: Secondary | ICD-10-CM | POA: Diagnosis not present

## 2015-11-30 MED ORDER — MECLIZINE HCL 25 MG PO TABS: 25.0000 mg | ORAL_TABLET | Freq: Three times a day (TID) | ORAL | Status: DC | PRN

## 2015-11-30 MED ORDER — LOSARTAN POTASSIUM 50 MG PO TABS: 50.0000 mg | ORAL_TABLET | Freq: Every day | ORAL | Status: DC

## 2015-11-30 NOTE — Patient Instructions (Signed)
Medication Instructions:  Your physician recommends that you continue on your current medications as directed. Please refer to the Current Medication list given to you today.  Labwork: NONE  Testing/Procedures: NONE  Follow-Up: Your physician wants you to follow-up in: 6 months with Dr. Skains. You will receive a reminder letter in the mail two months in advance. If you don't receive a letter, please call our office to schedule the follow-up appointment.   If you need a refill on your cardiac medications before your next appointment, please call your pharmacy.    

## 2015-12-15 ENCOUNTER — Emergency Department (HOSPITAL_COMMUNITY)
Admission: EM | Admit: 2015-12-15 | Discharge: 2015-12-15 | Disposition: A | Discharge status: Home / Self Care | Payer: Medicare Other | Attending: Emergency Medicine | Admitting: Emergency Medicine

## 2015-12-15 ENCOUNTER — Encounter (HOSPITAL_COMMUNITY): Payer: Self-pay | Admitting: *Deleted

## 2015-12-15 DIAGNOSIS — F17210 Nicotine dependence, cigarettes, uncomplicated: Secondary | ICD-10-CM | POA: Diagnosis not present

## 2015-12-15 DIAGNOSIS — L309 Dermatitis, unspecified: Secondary | ICD-10-CM | POA: Insufficient documentation

## 2015-12-15 DIAGNOSIS — E119 Type 2 diabetes mellitus without complications: Secondary | ICD-10-CM | POA: Insufficient documentation

## 2015-12-15 DIAGNOSIS — I10 Essential (primary) hypertension: Secondary | ICD-10-CM | POA: Diagnosis not present

## 2015-12-15 DIAGNOSIS — T7840XA Allergy, unspecified, initial encounter: Secondary | ICD-10-CM | POA: Diagnosis present

## 2015-12-15 DIAGNOSIS — Z79899 Other long term (current) drug therapy: Secondary | ICD-10-CM | POA: Diagnosis not present

## 2015-12-15 MED ORDER — TRIAMCINOLONE ACETONIDE 0.1 % EX CREA: 1.0000 "application " | TOPICAL_CREAM | Freq: Two times a day (BID) | CUTANEOUS | Status: DC

## 2015-12-15 MED ORDER — HYDROXYZINE HCL 25 MG PO TABS: 25.0000 mg | ORAL_TABLET | Freq: Four times a day (QID) | ORAL | Status: DC

## 2015-12-15 NOTE — Discharge Instructions (Signed)
Medications as prescribed. I recommend applying the triamcinolone cream to affected areas twice daily. I also recommend using Eucerin emmollient daily and that you may buy over-the-counter.   I recommend following up with the dermatology clinic listed above if you do not have improvement or have worsening of your rash over the next 1-2 weeks. Please return to the Emergency Department if symptoms worsen or new onset of fever, drainage, swelling, numbness, tingling, weakness, facial/neck swelling, difficulty breathing.

## 2015-12-15 NOTE — ED Notes (Addendum)
Pt presents via POV c/o BL hand itching.  Reports x 1 month.  Using cortisone cream at home.  A x 4, NAD.  Hands dry and flaky from scratching.   Small red bumps noted in between fingers.   Denies new lotion or soaps.

## 2015-12-15 NOTE — ED Provider Notes (Signed)
CSN: [id number]     Arrival date & time 12/15/15  1550 History  By signing my name below, I, Nichole Best, attest that this documentation has been prepared under the direction and in the presence of non-physician practitioner, Melburn HakeNicole Jniyah Dantuono, PA-C. Electronically Signed: Linna Darnerussell Best, Scribe. 12/15/2015. 4:12 PM.   Chief Complaint  Patient presents with  . Allergic Reaction    The history is provided by the patient. No language interpreter was used.     HPI Comments: Nichole Best is a 60 y.o. female with h/o DM and HTN who presents to the Emergency Department complaining of constant, worsening, burning, bilateral hand pruritis for the last month. She endorses associated dryness and redness to her bilateral hands. She states that she has never experienced similar symptoms prior to onset. Pt notes that she has been using cortisone cream with moderate, temporary relief. Pt has also soaked her hands in vinegar with moderate relief. She states that she is not itching anywhere other than her bilateral hands. Pt had not been outside more frequently than usual when her symptoms presented; she states that she uses gloves when she does yard work. Pt has not used new soaps, lotions, or detergents recently. She has not used new medications recently. She denies facial/neck swelling, SOB, fever, nausea, vomiting, drainage from her bilateral hands, or any other associated symptoms. Pt is establishing a new PCP and has an appointment set up for June 30th.  Past Medical History  Diagnosis Date  . Diabetes mellitus without complication (HCC)   . Hypertension   . Vertigo    History reviewed. No pertinent past surgical history. No family history on file. Social History  Substance Use Topics  . Smoking status: Current Every Day Smoker -- 0.25 packs/day    Types: Cigarettes  . Smokeless tobacco: None     Comment: Pt has been smoking since age 89  . Alcohol Use: No     Comment: Caffene user   OB History     No data available     Review of Systems  Constitutional: Negative for fever.  HENT: Negative for facial swelling.   Respiratory: Negative for shortness of breath.   Gastrointestinal: Negative for nausea and vomiting.  Skin: Positive for rash (bilateral hands; pruritic).   Allergies  Codeine and Lisinopril  Home Medications   Prior to Admission medications   Medication Sig Start Date End Date Taking? Authorizing Provider  hydrOXYzine (ATARAX/VISTARIL) 25 MG tablet Take 1 tablet (25 mg total) by mouth every 6 (six) hours. 12/15/15   Barrett HenleNicole Elizabeth Srijan Givan, PA-C  losartan (COZAAR) 50 MG tablet Take 1 tablet (50 mg total) by mouth daily. 11/30/15   Wendall StadePeter C Nishan, MD  meclizine (ANTIVERT) 25 MG tablet Take 1 tablet (25 mg total) by mouth 3 (three) times daily as needed for dizziness. 11/30/15   Wendall StadePeter C Nishan, MD  Multiple Vitamin (MULTIVITAMIN WITH MINERALS) TABS tablet Take 1 tablet by mouth daily.    Historical Provider, MD  triamcinolone cream (KENALOG) 0.1 % Apply 1 application topically 2 (two) times daily. 12/15/15   Satira SarkNicole Elizabeth Breven Guidroz, PA-C   BP 156/79 mmHg  Pulse 99  Temp(Src) 98.5 F (36.9 C) (Oral)  Resp 14  SpO2 97% Physical Exam  Constitutional: She is oriented to person, place, and time. She appears well-developed and well-nourished.  HENT:  Head: Normocephalic and atraumatic.  Mouth/Throat: Uvula is midline, oropharynx is clear and moist and mucous membranes are normal. No oropharyngeal exudate, posterior oropharyngeal edema, posterior oropharyngeal  erythema or tonsillar abscesses.  No facial or neck swelling.  Eyes: Conjunctivae and EOM are normal. Right eye exhibits no discharge. Left eye exhibits no discharge. No scleral icterus.  Neck: Normal range of motion. Neck supple.  Cardiovascular: Normal rate, regular rhythm, normal heart sounds and intact distal pulses.   Pulmonary/Chest: Effort normal and breath sounds normal. No respiratory distress. She has no  wheezes. She has no rales. She exhibits no tenderness.  Musculoskeletal: Normal range of motion. She exhibits no edema or tenderness.  2+ radial pulses. FROM of bilateral hands. Sensation grossly intact. Cap refill <2. Equal grip strength bilaterally.  Lymphadenopathy:    She has no cervical adenopathy.  Neurological: She is alert and oriented to person, place, and time.  Skin: Skin is warm and dry.  Lichenified erythematous hyper-pigmented plaques and papules noted in interdigit regions of bilateral hands with excoriations present. No lesions noted to palms or soles. No vesicles, pustules, bola, or drainage. No burrows.  Nursing note and vitals reviewed.   ED Course  Procedures (including critical care time)  DIAGNOSTIC STUDIES: Oxygen Saturation is 97% on RA, normal by my interpretation.    COORDINATION OF CARE: 4:12 PM Discussed treatment plan with pt at bedside and pt agreed to plan.  Labs Review Labs Reviewed - No data to display  Imaging Review No results found. I have personally reviewed and evaluated these images and lab results as part of my medical decision-making.   EKG Interpretation None      MDM   Final diagnoses:  Eczema    Rash consistent with atopic dermatitis. Patient denies any difficulty breathing or swallowing.  Pt has a patent airway without stridor and is handling secretions without difficulty; no angioedema. No blisters, no pustules, no warmth, no draining sinus tracts, no superficial abscesses, no bullous impetigo, no vesicles, no desquamation, no target lesions with dusky purpura or a central bulla. Not tender to touch. No concern for superimposed infection. No concern for SJS, TEN, TSS, tick borne illness, syphilis or other life-threatening condition. Will discharge home with triamcinolene cream, vistaril for itching and advise pt to use emollient as well. Pt given dermatology outpatient follow up info, advised pt to schedule an appointment if sxs  do not improve in the next 1-2 weeks. Discussed strict return precautions with pt.   I personally performed the services described in this documentation, which was scribed in my presence. The recorded information has been reviewed and is accurate.   Satira Sark Gorman, [place] 12/15/15 1639  Marily Memos, MD 12/16/15 2240

## 2015-12-30 ENCOUNTER — Emergency Department (HOSPITAL_COMMUNITY): Payer: Medicare Other

## 2015-12-30 ENCOUNTER — Encounter (HOSPITAL_COMMUNITY): Payer: Self-pay | Admitting: Emergency Medicine

## 2015-12-30 ENCOUNTER — Emergency Department (HOSPITAL_COMMUNITY)
Admission: EM | Admit: 2015-12-30 | Discharge: 2015-12-30 | Disposition: A | Discharge status: Home / Self Care | Payer: Medicare Other | Attending: Emergency Medicine | Admitting: Emergency Medicine

## 2015-12-30 DIAGNOSIS — J441 Chronic obstructive pulmonary disease with (acute) exacerbation: Secondary | ICD-10-CM | POA: Insufficient documentation

## 2015-12-30 DIAGNOSIS — F17210 Nicotine dependence, cigarettes, uncomplicated: Secondary | ICD-10-CM | POA: Insufficient documentation

## 2015-12-30 DIAGNOSIS — R0602 Shortness of breath: Secondary | ICD-10-CM | POA: Diagnosis present

## 2015-12-30 DIAGNOSIS — I10 Essential (primary) hypertension: Secondary | ICD-10-CM | POA: Insufficient documentation

## 2015-12-30 DIAGNOSIS — E119 Type 2 diabetes mellitus without complications: Secondary | ICD-10-CM | POA: Insufficient documentation

## 2015-12-30 HISTORY — DX: Gout, unspecified: M10.9

## 2015-12-30 HISTORY — DX: Cough: R05

## 2015-12-30 HISTORY — DX: Chronic cough: R05.3

## 2015-12-30 HISTORY — DX: Chronic obstructive pulmonary disease, unspecified: J44.9

## 2015-12-30 LAB — BASIC METABOLIC PANEL
Anion gap: 6 (ref 5–15)
BUN: 10 mg/dL (ref 6–20)
CHLORIDE: 103 mmol/L (ref 101–111)
CO2: 29 mmol/L (ref 22–32)
Calcium: 9.3 mg/dL (ref 8.9–10.3)
Creatinine, Ser: 0.92 mg/dL (ref 0.44–1.00)
GFR calc Af Amer: >60 mL/min (ref >60)
GFR calc non Af Amer: >60 mL/min (ref >60)
GLUCOSE: 95 mg/dL (ref 65–99)
Potassium: 4.2 mmol/L (ref 3.5–5.1)
Sodium: 138 mmol/L (ref 135–145)

## 2015-12-30 LAB — CBC
HCT: 37.6 % (ref 36.0–46.0)
Hemoglobin: 11.8 g/dL — ABNORMAL LOW (ref 12.0–15.0)
MCH: 22.6 pg — ABNORMAL LOW (ref 26.0–34.0)
MCHC: 31.4 g/dL (ref 30.0–36.0)
MCV: 72 fL — AB (ref 78.0–100.0)
PLATELETS: 253 10*3/uL (ref 150–400)
RBC: 5.22 MIL/uL — AB (ref 3.87–5.11)
RDW: 15.3 % (ref 11.5–15.5)
WBC: 7.5 10*3/uL (ref 4.0–10.5)

## 2015-12-30 LAB — TROPONIN I

## 2015-12-30 MED ORDER — ALBUTEROL SULFATE HFA 108 (90 BASE) MCG/ACT IN AERS
2.0000 | INHALATION_SPRAY | RESPIRATORY_TRACT | Status: AC
  Administered 2015-12-30: 2 via RESPIRATORY_TRACT
  Filled 2015-12-30: qty 6.7

## 2015-12-30 MED ORDER — IPRATROPIUM-ALBUTEROL 0.5-2.5 (3) MG/3ML IN SOLN
3.0000 mL | Freq: Once | RESPIRATORY_TRACT | Status: AC
  Administered 2015-12-30: 3 mL via RESPIRATORY_TRACT
  Filled 2015-12-30: qty 3

## 2015-12-30 MED ORDER — DOXYCYCLINE HYCLATE 100 MG PO TABS: 100.0000 mg | ORAL_TABLET | Freq: Two times a day (BID) | ORAL | Status: DC

## 2015-12-30 MED ORDER — AZITHROMYCIN 250 MG PO TABS: ORAL_TABLET | ORAL | Status: DC

## 2015-12-30 MED ORDER — ALBUTEROL SULFATE (2.5 MG/3ML) 0.083% IN NEBU
5.0000 mg | INHALATION_SOLUTION | Freq: Once | RESPIRATORY_TRACT | Status: AC
  Administered 2015-12-30: 5 mg via RESPIRATORY_TRACT
  Filled 2015-12-30: qty 6

## 2015-12-30 MED ORDER — ALBUTEROL SULFATE (2.5 MG/3ML) 0.083% IN NEBU
2.5000 mg | INHALATION_SOLUTION | Freq: Once | RESPIRATORY_TRACT | Status: AC
  Administered 2015-12-30: 2.5 mg via RESPIRATORY_TRACT
  Filled 2015-12-30: qty 3

## 2015-12-30 MED ORDER — PREDNISONE 20 MG PO TABS
60.0000 mg | ORAL_TABLET | Freq: Once | ORAL | Status: AC
  Administered 2015-12-30: 60 mg via ORAL
  Filled 2015-12-30: qty 3

## 2015-12-30 NOTE — ED Notes (Signed)
RN starting IV will collect labs 

## 2015-12-30 NOTE — ED Notes (Signed)
States she woke up around 0400 this morning feeling SOB with a bad cough. Hx of COPD, DM, HTN. Denies producing any mucus with the cough, denies chest pain. Inspiratory and expiratory wheezing in all lung fields with diminished lung sounds.

## 2015-12-30 NOTE — ED Provider Notes (Signed)
CSN: [id number]     Arrival date & time 12/30/15  0847 History   First MD Initiated Contact with Patient 12/30/15 [phone]     Chief Complaint  Patient presents with  . Shortness of Breath  . Cough     HPI  Pt was seen at 0920.  Per pt, c/o gradual onset and worsening of persistent cough, wheezing and SOB since overnight last night.  Describes her symptoms as "my COPD is acting up."  States she does not have MDI or neb machine at home.  Denies CP/palpitations, no back pain, no abd pain, no N/V/D, no fevers, no rash.    Past Medical History  Diagnosis Date  . Diabetes mellitus without complication (HCC)   . Hypertension   . Vertigo   . COPD (chronic obstructive pulmonary disease) (HCC)   . Chronic cough   . Gout    History reviewed. No pertinent past surgical history.  Social History  Substance Use Topics  . Smoking status: Current Every Day Smoker -- 0.25 packs/day    Types: Cigarettes  . Smokeless tobacco: None     Comment: Pt has been smoking since age 24  . Alcohol Use: No     Comment: Caffene user    Review of Systems ROS: Statement: All systems negative except as marked or noted in the HPI; Constitutional: Negative for fever and chills. ; ; Eyes: Negative for eye pain, redness and discharge. ; ; ENMT: Negative for ear pain, hoarseness, nasal congestion, sinus pressure and sore throat. ; ; Cardiovascular: Negative for chest pain, palpitations, diaphoresis, and peripheral edema. ; ; Respiratory: +cough, wheezing, SOB. Negative for stridor. ; ; Gastrointestinal: Negative for nausea, vomiting, diarrhea, abdominal pain, blood in stool, hematemesis, jaundice and rectal bleeding. . ; ; Genitourinary: Negative for dysuria, flank pain and hematuria. ; ; Musculoskeletal: Negative for back pain and neck pain. Negative for swelling and trauma.; ; Skin: Negative for pruritus, rash, abrasions, blisters, bruising and skin lesion.; ; Neuro: Negative for headache, lightheadedness and neck  stiffness. Negative for weakness, altered level of consciousness, altered mental status, extremity weakness, paresthesias, involuntary movement, seizure and syncope.      Allergies  Codeine and Lisinopril  Home Medications   Prior to Admission medications   Medication Sig Start Date End Date Taking? Authorizing Provider  hydrOXYzine (ATARAX/VISTARIL) 25 MG tablet Take 1 tablet (25 mg total) by mouth every 6 (six) hours. 12/15/15  Yes Satira Sark Nadeau, PA-C  losartan (COZAAR) 50 MG tablet Take 1 tablet (50 mg total) by mouth daily. 11/30/15  Yes Wendall Stade, MD  meclizine (ANTIVERT) 25 MG tablet Take 1 tablet (25 mg total) by mouth 3 (three) times daily as needed for dizziness. 11/30/15  Yes Wendall Stade, MD  Multiple Vitamin (MULTIVITAMIN WITH MINERALS) TABS tablet Take 1 tablet by mouth daily.   Yes Historical Provider, MD  triamcinolone cream (KENALOG) 0.1 % Apply 1 application topically 2 (two) times daily. 12/15/15  Yes Satira Sark Nadeau, PA-C   BP 135/82 mmHg  Pulse 80  Temp(Src) 98.2 F (36.8 C) (Oral)  Resp 22  SpO2 97% Physical Exam  0925: Physical examination:  Nursing notes reviewed; Vital signs and O2 SAT reviewed;  Constitutional: Well developed, Well nourished, Well hydrated, In no acute distress; Head:  Normocephalic, atraumatic; Eyes: EOMI, PERRL, No scleral icterus; ENMT: Mouth and pharynx normal, Mucous membranes moist; Neck: Supple, Full range of motion, No lymphadenopathy; Cardiovascular: Regular rate and rhythm, No gallop; Respiratory: Breath sounds diminished &  equal bilaterally, faint scattered wheezes. No audible wheezing. Speaking full sentences, Normal respiratory effort/excursion; Chest: Nontender, Movement normal; Abdomen: Soft, Nontender, Nondistended, Normal bowel sounds; Genitourinary: No CVA tenderness; Extremities: Pulses normal, No tenderness, No edema, No calf edema or asymmetry.; Neuro: AA&Ox3, Major CN grossly intact.  Speech clear. No gross  focal motor or sensory deficits in extremities.; Skin: Color normal, Warm, Dry.   ED Course  Procedures (including critical care time) Labs Review  Imaging Review  I have personally reviewed and evaluated these images and lab results as part of my medical decision-making.   EKG Interpretation   Date/Time:  Saturday December 30 2015 09:00:16 EDT Ventricular Rate:  76 PR Interval:  169 QRS Duration: 94 QT Interval:  380 QTC Calculation: 427 R Axis:   55 Text Interpretation:  Sinus rhythm Consider left atrial enlargement No  significant change since last tracing Confirmed by KNAPP  MD-J, JON  [id number]) on 12/30/2015 9:07:17 AM      MDM  MDM Reviewed: previous chart, nursing note and vitals Reviewed previous: labs and ECG Interpretation: labs, x-ray and ECG     Results for orders placed or performed during the hospital encounter of 12/30/15  Basic metabolic panel  Result Value Ref Range   Sodium 138 135 - 145 mmol/L   Potassium 4.2 3.5 - 5.1 mmol/L   Chloride 103 101 - 111 mmol/L   CO2 29 22 - 32 mmol/L   Glucose, Bld 95 65 - 99 mg/dL   BUN 10 6 - 20 mg/dL   Creatinine, Ser 6.290.92 0.44 - 1.00 mg/dL   Calcium 9.3 8.9 - 52.810.3 mg/dL   GFR calc non Af Amer >60 >60 mL/min   GFR calc Af Amer >60 >60 mL/min   Anion gap 6 5 - 15  CBC  Result Value Ref Range   WBC 7.5 4.0 - 10.5 K/uL   RBC 5.22 (H) 3.87 - 5.11 MIL/uL   Hemoglobin 11.8 (L) 12.0 - 15.0 g/dL   HCT 41.337.6 24.436.0 - 01.046.0 %   MCV 72.0 (L) 78.0 - 100.0 fL   MCH 22.6 (L) 26.0 - 34.0 pg   MCHC 31.4 30.0 - 36.0 g/dL   RDW 27.215.3 53.611.5 - 64.415.5 %   Platelets 253 150 - 400 K/uL  Troponin I  Result Value Ref Range   Troponin I <0.03 <0.031 ng/mL   Dg Chest 2 View 12/30/2015  CLINICAL DATA:  SOB with a bad cough. Hx of COPD, DM, HTN. Denies producing any mucus with the cough, denies chest pain. Inspiratory and expiratory wheezing in all lung fields with diminished lung sounds. EXAM: CHEST - 2 VIEW COMPARISON:  11/18/2015 FINDINGS:  Lungs are clear. Heart size and mediastinal contours are within normal limits. No effusion. Visualized bones unremarkable. IMPRESSION: No acute cardiopulmonary disease. Electronically Signed   By: Corlis Leak  Hassell M.D.   On: 12/30/2015 10:37    1145:  H/H per baseline. Pt states she "feels better" after neb x2 and steroid.  NAD, lungs CTA bilat, no wheezing, resps easy, speaking full sentences, Sats 100% R/A.  Pt ambulated around the ED with Sats remaining 94-99 % R/A, resps easy, NAD.  Wants to go home now. Tx symptomatically, f/u PMD. Dx and testing d/w pt.  Questions answered.  Verb understanding, agreeable to d/c home with outpt f/u.    Samuel JesterKathleen Marina Desire, DO 01/03/16 2239

## 2015-12-30 NOTE — Discharge Instructions (Signed)
Take the prescriptions as directed.  Use your albuterol inhaler (2 to 4 puffs) every 4 hours for the next 7 days, then as needed for cough, wheezing, or shortness of breath.  Call your regular medical doctor Monday morning to schedule a follow up appointment within the next 3 days.  Return to the Emergency Department immediately sooner if worsening.  ° °

## 2015-12-30 NOTE — ED Notes (Signed)
Patient transported to X-ray 

## 2015-12-30 NOTE — ED Notes (Signed)
Discharge instructions, follow up care, and rx x2 reviewed with patient. Patient verbalized understanding. 

## 2015-12-30 NOTE — ED Notes (Signed)
MD at bedside. 

## 2015-12-30 NOTE — ED Notes (Signed)
Patient ambulated around nurses' station with oxygen saturation staying between 94-99%

## 2016-03-04 ENCOUNTER — Ambulatory Visit (INDEPENDENT_AMBULATORY_CARE_PROVIDER_SITE_OTHER): Payer: Medicare Other | Admitting: Family Medicine

## 2016-03-04 ENCOUNTER — Encounter: Payer: Self-pay | Admitting: Family Medicine

## 2016-03-04 VITALS — BP 149/95 | HR 62 | Temp 97.7°F | Resp 16 | Ht 66.0 in | Wt 162.0 lb

## 2016-03-04 DIAGNOSIS — Z1159 Encounter for screening for other viral diseases: Secondary | ICD-10-CM

## 2016-03-04 DIAGNOSIS — Z23 Encounter for immunization: Secondary | ICD-10-CM | POA: Diagnosis not present

## 2016-03-04 DIAGNOSIS — Z1239 Encounter for other screening for malignant neoplasm of breast: Secondary | ICD-10-CM

## 2016-03-04 DIAGNOSIS — E138 Other specified diabetes mellitus with unspecified complications: Secondary | ICD-10-CM | POA: Diagnosis not present

## 2016-03-04 LAB — POCT GLYCOSYLATED HEMOGLOBIN (HGB A1C): Hemoglobin A1C: 6.1

## 2016-03-04 NOTE — Progress Notes (Signed)
Nichole Best, is a 60 y.o. female  YKZ:[id number]  LTJ:[id number]  DOB - December 30, 1955  CC:  Chief Complaint  Patient presents with  . Follow-up  . Hypertension       HPI: Nichole Best is a 60 y.o. female here for follow-up diabetes. Her last A1C in April was 6.4. Her only other A1C a few months earlier was 6.6. She has never been started on medication. She reports doing well, trying to follow a diabetic diet and walking several times a week.  She had C-met, CBC, and lipids earlier this year and does not need a repeat at this time. She does need Hepatitis C screening. She reports a colonoscopy in 2015. She reports an opthalmic exam earlier year and thinks she probably may have glaucoma. She reports being up to date on PAP but needing mammogram. She accepts Tdap today but declines pneumonia.   Allergies  Allergen Reactions  . Codeine Other (See Comments)    seizures  . Lisinopril Cough   Past Medical History:  Diagnosis Date  . Chronic cough   . COPD (chronic obstructive pulmonary disease) ([place])   . Diabetes mellitus without complication ([place])   . Gout   . Hypertension   . Vertigo    Current Outpatient Prescriptions on File Prior to Visit  Medication Sig Dispense Refill  . hydrOXYzine (ATARAX/VISTARIL) 25 MG tablet Take 1 tablet (25 mg total) by mouth every 6 (six) hours. 12 tablet 0  . losartan (COZAAR) 50 MG tablet Take 1 tablet (50 mg total) by mouth daily. 30 tablet 11  . meclizine (ANTIVERT) 25 MG tablet Take 1 tablet (25 mg total) by mouth 3 (three) times daily as needed for dizziness. 60 tablet 11  . triamcinolone cream (KENALOG) 0.1 % Apply 1 application topically 2 (two) times daily. 30 g 0  . azithromycin (ZITHROMAX) 250 MG tablet Take 2 tablets PO day 1, then 1 tab PO daily x4 days. (Patient not taking: Reported on 03/04/2016) 6 tablet 0  . doxycycline (VIBRA-TABS) 100 MG tablet Take 1 tablet (100 mg total) by mouth 2 (two) times daily. (Patient not taking: Reported on  03/04/2016) 14 tablet 0  . Multiple Vitamin (MULTIVITAMIN WITH MINERALS) TABS tablet Take 1 tablet by mouth daily.     No current facility-administered medications on file prior to visit.    History reviewed. No pertinent family history. Social History   Social History  . Marital status: Legally Separated    Spouse name: N/A  . Number of children: N/A  . Years of education: N/A   Occupational History  . Not on file.   Social History Main Topics  . Smoking status: Current Every Day Smoker    Packs/day: 0.25    Types: Cigarettes  . Smokeless tobacco: Never Used     Comment: Pt has been smoking since age 43  . Alcohol use Yes     Comment: occ  . Drug use: No  . Sexual activity: Not on file   Other Topics Concern  . Not on file   Social History Narrative  . No narrative on file    Review of Systems: Constitutional: Negative for fever, chills, appetite change, weight loss. Positive for fatigue Skin: Positive for hand eczema HENT: Negative for ear pain, ear discharge.nose bleeds Eyes: Negative for pain, discharge, redness, itching and visual disturbance. Neck: Negative for pain, stiffness Respiratory: Negative for cough, shortness of breath,   Cardiovascular: Negative for chest pain, palpitations. Occ swelling of lower legs  and ankles Gastrointestinal: Negative for abdominal pain, nausea, vomiting, diarrhea, constipations Genitourinary: Negative for dysuria, urgency, frequency, hematuria,  Musculoskeletal: Negative for back pain.  joint  swelling, and gait problem.Negative for weakness.Positive for knee pain Neurological: Negative for tremors, seizures, syncope,   light-headedness, numbness and headaches. Positive for occ dizziness Hematological: Negative for easy bruising or bleeding Psychiatric/Behavioral: Negative for depression, anxiety, decreased concentration, confusion   Objective:   Vitals:   03/04/16 1104  BP: (!) 149/95  Pulse: 62  Resp: 16  Temp: 97.7 F  (36.5 C)    Physical Exam: Constitutional: Patient appears well-developed and well-nourished. No distress. HENT: Normocephalic, atraumatic, External right and left ear normal. Oropharynx is clear and moist.  Eyes: Conjunctivae and EOM are normal. PERRLA, no scleral icterus. Neck: Normal ROM. Neck supple. No lymphadenopathy, No thyromegaly. CVS: RRR, S1/S2 +, no murmurs, no gallops, no rubs Pulmonary: Effort and breath sounds normal, no stridor, rhonchi, wheezes, rales.  Abdominal: Soft. Normoactive BS,, no distension, tenderness, rebound or guarding.  Musculoskeletal: Normal range of motion. No edema and no tenderness.  Neuro: Alert.Normal muscle tone coordination. Non-focal Skin: Skin is warm and dry. No rash noted. Not diaphoretic. No erythema. No pallor. Psychiatric: Normal mood and affect. Behavior, judgment, thought content normal.  Lab Results  Component Value Date   WBC 7.5 12/30/2015   HGB 11.8 (L) 12/30/2015   HCT 37.6 12/30/2015   MCV 72.0 (L) 12/30/2015   PLT 253 12/30/2015   Lab Results  Component Value Date   CREATININE 0.92 12/30/2015   BUN 10 12/30/2015   NA 138 12/30/2015   K 4.2 12/30/2015   CL 103 12/30/2015   CO2 29 12/30/2015    Lab Results  Component Value Date   HGBA1C 6.1 03/04/2016   Lipid Panel     Component Value Date/Time   CHOL 119 11/19/2015 0522   TRIG 88 11/19/2015 0522   HDL 26 (L) 11/19/2015 0522   CHOLHDL 4.6 11/19/2015 0522   VLDL 18 11/19/2015 0522   LDLCALC 75 11/19/2015 0522       Assessment and plan:   1. Diabetes mellitus of other type with complication ([place])  - POCT glycosylated hemoglobin (Hb A1C)  6.1  2. Need for hepatitis C screening test  - Hepatitis C Antibody    Return in about 6 months (around 09/04/2016).  The patient was given clear instructions to go to ER or return to medical center if symptoms don't improve, worsen or new problems develop. The patient verbalized understanding.    Micheline Chapman  FNP  03/04/2016, 2:01 PM

## 2016-03-05 LAB — HEPATITIS C ANTIBODY: HCV Ab: NEGATIVE

## 2016-05-02 ENCOUNTER — Other Ambulatory Visit: Payer: Self-pay | Admitting: Family Medicine

## 2016-05-02 DIAGNOSIS — Z1231 Encounter for screening mammogram for malignant neoplasm of breast: Secondary | ICD-10-CM

## 2016-05-03 ENCOUNTER — Ambulatory Visit
Admission: RE | Admit: 2016-05-03 | Discharge: 2016-05-03 | Disposition: A | Discharge status: Home / Self Care | Payer: Medicare Other | Source: Ambulatory Visit | Attending: Family Medicine | Admitting: Family Medicine

## 2016-05-03 ENCOUNTER — Ambulatory Visit: Payer: Medicare Other

## 2016-05-03 DIAGNOSIS — Z1231 Encounter for screening mammogram for malignant neoplasm of breast: Secondary | ICD-10-CM

## 2016-05-31 ENCOUNTER — Encounter: Payer: Self-pay | Admitting: Cardiology

## 2016-05-31 ENCOUNTER — Ambulatory Visit: Payer: Medicare Other | Admitting: Cardiology

## 2016-05-31 NOTE — Progress Notes (Deleted)
Cardiology Office Note    Date:  05/31/2016   ID:  Nichole Best, DOB [birth year], MRN [id number]  PCP:  Concepcion LivingBERNHARDT, LINDA, NP  Cardiologist:  Donato Schultz*** Georgette Helmer, MD   No chief complaint on file.   History of Present Illness:  Nichole Best is a 60 y.o. female ***    Past Medical History:  Diagnosis Date  . Chronic cough   . COPD (chronic obstructive pulmonary disease) (HCC)   . Diabetes mellitus without complication (HCC)   . Gout   . Hypertension   . Vertigo     No past surgical history on file.  Current Medications: Outpatient Medications Prior to Visit  Medication Sig Dispense Refill  . Albuterol Sulfate (PROVENTIL HFA IN) Inhale into the lungs.    Marland Kitchen. azithromycin (ZITHROMAX) 250 MG tablet Take 2 tablets PO day 1, then 1 tab PO daily x4 days. (Patient not taking: Reported on 03/04/2016) 6 tablet 0  . doxycycline (VIBRA-TABS) 100 MG tablet Take 1 tablet (100 mg total) by mouth 2 (two) times daily. (Patient not taking: Reported on 03/04/2016) 14 tablet 0  . hydrOXYzine (ATARAX/VISTARIL) 25 MG tablet Take 1 tablet (25 mg total) by mouth every 6 (six) hours. 12 tablet 0  . losartan (COZAAR) 50 MG tablet Take 1 tablet (50 mg total) by mouth daily. 30 tablet 11  . meclizine (ANTIVERT) 25 MG tablet Take 1 tablet (25 mg total) by mouth 3 (three) times daily as needed for dizziness. 60 tablet 11  . Multiple Vitamin (MULTIVITAMIN WITH MINERALS) TABS tablet Take 1 tablet by mouth daily.    Marland Kitchen. triamcinolone cream (KENALOG) 0.1 % Apply 1 application topically 2 (two) times daily. 30 g 0   No facility-administered medications prior to visit.      Allergies:   Codeine and Lisinopril   Social History   Social History  . Marital status: Legally Separated    Spouse name: N/A  . Number of children: N/A  . Years of education: N/A   Social History Main Topics  . Smoking status: Current Every Day Smoker    Packs/day: 0.25    Types: Cigarettes  . Smokeless tobacco: Never Used   Comment: Pt has been smoking since age 60  . Alcohol use Yes     Comment: occ  . Drug use: No  . Sexual activity: Not on file   Other Topics Concern  . Not on file   Social History Narrative  . No narrative on file     Family History:  The patient's ***family history is not on file.   ROS:   Please see the history of present illness.    ROS All other systems reviewed and are negative.   PHYSICAL EXAM:   VS:  There were no vitals taken for this visit.   GEN: Well nourished, well developed, in no acute distress HEENT: normal Neck: no JVD, carotid bruits, or masses Cardiac: ***RRR; no murmurs, rubs, or gallops,no edema  Respiratory:  clear to auscultation bilaterally, normal work of breathing GI: soft, nontender, nondistended, + BS MS: no deformity or atrophy Skin: warm and dry, no rash Neuro:  Alert and Oriented x 3, Strength and sensation are intact Psych: euthymic mood, full affect  Wt Readings from Last 3 Encounters:  03/04/16 162 lb (73.5 kg)  11/30/15 165 lb (74.8 kg)  11/18/15 164 lb 7 oz (74.6 kg)      Studies/Labs Reviewed:   EKG:  EKG is*** ordered today.  The ekg ordered  today demonstrates ***  Recent Labs: 09/24/2015: ALT 14 12/30/2015: BUN 10; Creatinine, Ser 0.92; Hemoglobin 11.8; Platelets 253; Potassium 4.2; Sodium 138   Lipid Panel    Component Value Date/Time   CHOL 119 11/19/2015 0522   TRIG 88 11/19/2015 0522   HDL 26 (L) 11/19/2015 0522   CHOLHDL 4.6 11/19/2015 0522   VLDL 18 11/19/2015 0522   LDLCALC 75 11/19/2015 0522    Additional studies/ records that were reviewed today include:  ***    ASSESSMENT:    1. Other chest pain      PLAN:  In order of problems listed above:  1. ***    Medication Adjustments/Labs and Tests Ordered: Current medicines are reviewed at length with the patient today.  Concerns regarding medicines are outlined above.  Medication changes, Labs and Tests ordered today are listed in the Patient  Instructions below. There are no Patient Instructions on file for this visit.   Signed, Donato SchultzMark Tymeir Weathington, MD  05/31/2016 6:35 AM    [facility] [address], [place], [place]  [address] Phone: [phone]; Fax: [phone]

## 2016-06-04 ENCOUNTER — Ambulatory Visit (INDEPENDENT_AMBULATORY_CARE_PROVIDER_SITE_OTHER): Payer: Medicare Other | Admitting: Cardiology

## 2016-06-04 ENCOUNTER — Encounter: Payer: Self-pay | Admitting: Cardiology

## 2016-06-04 VITALS — BP 134/86 | HR 70 | Ht 66.0 in | Wt 164.8 lb

## 2016-06-04 DIAGNOSIS — Z720 Tobacco use: Secondary | ICD-10-CM | POA: Diagnosis not present

## 2016-06-04 DIAGNOSIS — I10 Essential (primary) hypertension: Secondary | ICD-10-CM | POA: Diagnosis not present

## 2016-06-04 DIAGNOSIS — R0789 Other chest pain: Secondary | ICD-10-CM

## 2016-06-04 NOTE — Patient Instructions (Signed)
Medication Instructions:  The current medical regimen is effective;  continue present plan and medications.  Follow-Up: Follow up as needed with Dr Skains.  Thank you for choosing [facility]!!     

## 2016-06-04 NOTE — Progress Notes (Signed)
Cardiology Office Note    Date:  06/04/2016   ID:  Nichole PinesBrenda Lutz, DOB 1955/11/25, MRN [id number]  PCP:  Concepcion LivingBERNHARDT, LINDA, NP  Cardiologist:   Donato SchultzMark Shon Indelicato, MD     History of Present Illness:  Nichole Best is a 60 y.o. female here for a six-month follow-up, long-term smoker with hypertension and diabetes. Nuclear stress test was performed in April 2017 that showed no ischemia and normal EF. She's had complaints of dizziness and vertigo at times.  She's not had any chest pain. She has associated with a primary physician and is doing quite well. She is pleased with her diabetic care, diet controlled. She has been on losartan and doing well.  No shortness of breath, no bleeding, no syncope  Past Medical History:  Diagnosis Date  . Chronic cough   . COPD (chronic obstructive pulmonary disease) (HCC)   . Diabetes mellitus without complication (HCC)   . Gout   . Hypertension   . Vertigo     No past surgical history on file.  Current Medications: Outpatient Medications Prior to Visit  Medication Sig Dispense Refill  . Albuterol Sulfate (PROVENTIL HFA IN) Inhale 1 puff into the lungs 2 (two) times daily as needed (shortness of breath).     . losartan (COZAAR) 50 MG tablet Take 1 tablet (50 mg total) by mouth daily. 30 tablet 11  . meclizine (ANTIVERT) 25 MG tablet Take 1 tablet (25 mg total) by mouth 3 (three) times daily as needed for dizziness. 60 tablet 11  . azithromycin (ZITHROMAX) 250 MG tablet Take 2 tablets PO day 1, then 1 tab PO daily x4 days. (Patient not taking: Reported on 03/04/2016) 6 tablet 0  . doxycycline (VIBRA-TABS) 100 MG tablet Take 1 tablet (100 mg total) by mouth 2 (two) times daily. (Patient not taking: Reported on 03/04/2016) 14 tablet 0  . hydrOXYzine (ATARAX/VISTARIL) 25 MG tablet Take 1 tablet (25 mg total) by mouth every 6 (six) hours. 12 tablet 0  . Multiple Vitamin (MULTIVITAMIN WITH MINERALS) TABS tablet Take 1 tablet by mouth daily.    Marland Kitchen. triamcinolone  cream (KENALOG) 0.1 % Apply 1 application topically 2 (two) times daily. 30 g 0   No facility-administered medications prior to visit.      Allergies:   Codeine and Lisinopril   Social History   Social History  . Marital status: Legally Separated    Spouse name: N/A  . Number of children: N/A  . Years of education: N/A   Social History Main Topics  . Smoking status: Current Every Day Smoker    Packs/day: 0.25    Types: Cigarettes  . Smokeless tobacco: Never Used     Comment: Pt has been smoking since age 60  . Alcohol use Yes     Comment: occ  . Drug use: No  . Sexual activity: Not Asked   Other Topics Concern  . None   Social History Narrative  . None     Family History:  No early family history of CAD  ROS:   Please see the history of present illness.    ROS All other systems reviewed and are negative.   PHYSICAL EXAM:   VS:  BP 134/86   Pulse 70   Ht 5\' 6"  (1.676 m)   Wt 164 lb 12.8 oz (74.8 kg)   LMP  (LMP Unknown)   BMI 26.60 kg/m    GEN: Well nourished, well developed, in no acute distress  HEENT: normal  Neck: no JVD, carotid bruits, or masses Cardiac: RRR; no murmurs, rubs, or gallops,no edema  Respiratory:  clear to auscultation bilaterally, normal work of breathing GI: soft, nontender, nondistended, + BS MS: no deformity or atrophy  Skin: warm and dry, no rash Neuro:  Alert and Oriented x 3, Strength and sensation are intact Psych: euthymic mood, full affect  Wt Readings from Last 3 Encounters:  06/04/16 164 lb 12.8 oz (74.8 kg)  03/04/16 162 lb (73.5 kg)  11/30/15 165 lb (74.8 kg)      Studies/Labs Reviewed:   EKG:  No new EKG today   Recent Labs: 09/24/2015: ALT 14 12/30/2015: BUN 10; Creatinine, Ser 0.92; Hemoglobin 11.8; Platelets 253; Potassium 4.2; Sodium 138   Lipid Panel    Component Value Date/Time   CHOL 119 11/19/2015 0522   TRIG 88 11/19/2015 0522   HDL 26 (L) 11/19/2015 0522   CHOLHDL 4.6 11/19/2015 0522   VLDL 18  11/19/2015 0522   LDLCALC 75 11/19/2015 0522    Additional studies/ records that were reviewed today include:  Prior notes reviewed, lab work reviewed    ASSESSMENT:    1. Other chest pain   2. Essential hypertension   3. Tobacco abuse      PLAN:  In order of problems listed above:  Chest pain-resolved. Nuclear stress test reassuring in April 2017.  Hypertension essential-much better controlled with angiotensin receptor blocker. Will be monitored by primary physician.  Smoking-tobacco cessation  Diabetes-low carbohydrate diet. No medications.  We will see her back on as-needed basis    Medication Adjustments/Labs and Tests Ordered: Current medicines are reviewed at length with the patient today.  Concerns regarding medicines are outlined above.  Medication changes, Labs and Tests ordered today are listed in the Patient Instructions below. Patient Instructions  Medication Instructions:  The current medical regimen is effective;  continue present plan and medications.  Follow-Up: Follow up as needed with Dr Anne FuSkains.  [facility]!!        Signed, Donato SchultzMark Reva Pinkley, MD  06/04/2016 1:42 PM    [facility] [address], [place], [place]  [address] Phone: [phone]; Fax: [phone]

## 2016-06-06 ENCOUNTER — Ambulatory Visit (INDEPENDENT_AMBULATORY_CARE_PROVIDER_SITE_OTHER): Payer: Medicare Other | Admitting: Family Medicine

## 2016-06-06 ENCOUNTER — Encounter: Payer: Self-pay | Admitting: Family Medicine

## 2016-06-06 VITALS — BP 124/74 | HR 69 | Temp 97.6°F | Resp 16 | Ht 66.0 in | Wt 166.0 lb

## 2016-06-06 DIAGNOSIS — E118 Type 2 diabetes mellitus with unspecified complications: Secondary | ICD-10-CM | POA: Diagnosis not present

## 2016-06-06 LAB — CBC WITH DIFFERENTIAL/PLATELET
BASOS ABS: 0 {cells}/uL (ref 0–200)
BASOS PCT: 0 %
EOS ABS: 204 {cells}/uL (ref 15–500)
Eosinophils Relative: 3 %
HEMATOCRIT: 40.6 % (ref 35.0–45.0)
Hemoglobin: 12.5 g/dL (ref 11.7–15.5)
LYMPHS PCT: 51 %
Lymphs Abs: 3468 cells/uL (ref 850–3900)
MCH: 23.5 pg — AB (ref 27.0–33.0)
MCHC: 30.8 g/dL — ABNORMAL LOW (ref 32.0–36.0)
MCV: 76.5 fL — AB (ref 80.0–100.0)
MONO ABS: 544 {cells}/uL (ref 200–950)
MPV: 10.2 fL (ref 7.5–12.5)
Monocytes Relative: 8 %
Neutro Abs: 2584 cells/uL (ref 1500–7800)
Neutrophils Relative %: 38 %
Platelets: 234 10*3/uL (ref 140–400)
RBC: 5.31 MIL/uL — ABNORMAL HIGH (ref 3.80–5.10)
RDW: 15.7 % — AB (ref 11.0–15.0)
WBC: 6.8 10*3/uL (ref 3.8–10.8)

## 2016-06-06 LAB — TSH: TSH: 1.1 mIU/L

## 2016-06-10 NOTE — Progress Notes (Signed)
Nichole PinesBrenda Best, is a 60 y.o. female  WUJ:[id number]:[id number]  NFA:[id number]  DOB - 1956/05/16  CC:  Chief Complaint  Patient presents with  . Hypertension  . Diabetes       HPI: Nichole Best is a 60 y.o. female here for follow-up  hypertension. According to her chart she has a history of COPD. She has a diagnosis of diabetes in her chart but labs indicate prediabetes. She has never been on medication. She reports doing well. She tries to follow a diabetic and she reports walking several times a week. Her current medications are albuterol, losartan and meclizine prescribe by cardiology.    Allergies  Allergen Reactions  . Codeine Other (See Comments)    seizures  . Lisinopril Cough   Past Medical History:  Diagnosis Date  . Chronic cough   . COPD (chronic obstructive pulmonary disease) (HCC)   . Diabetes mellitus without complication (HCC)   . Gout   . Hypertension   . Vertigo    Current Outpatient Prescriptions on File Prior to Visit  Medication Sig Dispense Refill  . Albuterol Sulfate (PROVENTIL HFA IN) Inhale 1 puff into the lungs 2 (two) times daily as needed (shortness of breath).     . losartan (COZAAR) 50 MG tablet Take 1 tablet (50 mg total) by mouth daily. 30 tablet 11  . meclizine (ANTIVERT) 25 MG tablet Take 1 tablet (25 mg total) by mouth 3 (three) times daily as needed for dizziness. 60 tablet 11   No current facility-administered medications on file prior to visit.    History reviewed. No pertinent family history. Social History   Social History  . Marital status: Legally Separated    Spouse name: N/A  . Number of children: N/A  . Years of education: N/A   Occupational History  . Not on file.   Social History Main Topics  . Smoking status: Current Every Day Smoker    Packs/day: 0.25    Types: Cigarettes  . Smokeless tobacco: Never Used     Comment: Pt has been smoking since age 60  . Alcohol use Yes     Comment: occ  . Drug use: No  . Sexual  activity: Not on file   Other Topics Concern  . Not on file   Social History Narrative  . No narrative on file    Review of Systems: Constitutional: Negative Skin: Negative HENT: Negative  Eyes: Negative  Neck: Negative Respiratory: Negative Cardiovascular: Negative Gastrointestinal: Negative Genitourinary: Negative  Musculoskeletal: Negative   Neurological: Negative for Hematological: Negative  Psychiatric/Behavioral: Negative    Objective:   Vitals:   06/06/16 1451 06/06/16 1522  BP: (!) 162/81 124/74  Pulse: 69   Resp: 16   Temp: 97.6 F (36.4 C)     Physical Exam: Constitutional: Patient appears well-developed and well-nourished. No distress. HENT: Normocephalic, atraumatic, External right and left ear normal. Oropharynx is clear and moist.  Eyes: Conjunctivae and EOM are normal. PERRLA, no scleral icterus. Neck: Normal ROM. Neck supple. No lymphadenopathy, No thyromegaly. CVS: RRR, S1/S2 +, no murmurs, no gallops, no rubs Pulmonary: Effort and breath sounds normal, no stridor, rhonchi, wheezes, rales.  Abdominal: Soft. Normoactive BS,, no distension, tenderness, rebound or guarding.  Musculoskeletal: Normal range of motion. No edema and no tenderness.  Neuro: Alert.Normal muscle tone coordination. Non-focal Skin: Skin is warm and dry. No rash noted. Not diaphoretic. No erythema. No pallor. Psychiatric: Normal mood and affect. Behavior, judgment, thought content normal.  Lab Results  Component Value Date   WBC 6.8 06/06/2016   HGB 12.5 06/06/2016   HCT 40.6 06/06/2016   MCV 76.5 (L) 06/06/2016   PLT 234 06/06/2016   Lab Results  Component Value Date   CREATININE 0.92 12/30/2015   BUN 10 12/30/2015   NA 138 12/30/2015   K 4.2 12/30/2015   CL 103 12/30/2015   CO2 29 12/30/2015    Lab Results  Component Value Date   HGBA1C 6.1 03/04/2016   Lipid Panel     Component Value Date/Time   CHOL 119 11/19/2015 0522   TRIG 88 11/19/2015 0522   HDL  26 (L) 11/19/2015 0522   CHOLHDL 4.6 11/19/2015 0522   VLDL 18 11/19/2015 0522   LDLCALC 75 11/19/2015 0522        Assessment and plan:    1. Prediabetes - CBC with Differential - TSH - Microalbumin, urine   Return in about 6 months (around 12/04/2016).  The patient was given clear instructions to go to ER or return to medical center if symptoms don't improve, worsen or new problems develop. The patient verbalized understanding.    Henrietta HooverLinda C Cythina Mickelsen FNP  06/10/2016, 1:00 PM

## 2016-06-17 ENCOUNTER — Encounter: Payer: Self-pay | Admitting: Family Medicine

## 2016-06-19 NOTE — Telephone Encounter (Signed)
Please advise on an OV?

## 2016-09-09 ENCOUNTER — Encounter: Payer: Self-pay | Admitting: Family Medicine

## 2016-09-09 ENCOUNTER — Ambulatory Visit (INDEPENDENT_AMBULATORY_CARE_PROVIDER_SITE_OTHER): Payer: Medicare Other | Admitting: Family Medicine

## 2016-09-09 VITALS — BP 145/92 | HR 75 | Temp 97.6°F | Resp 12 | Ht 66.0 in | Wt 175.0 lb

## 2016-09-09 DIAGNOSIS — E119 Type 2 diabetes mellitus without complications: Secondary | ICD-10-CM

## 2016-09-09 DIAGNOSIS — I10 Essential (primary) hypertension: Secondary | ICD-10-CM

## 2016-09-09 LAB — POCT GLYCOSYLATED HEMOGLOBIN (HGB A1C): Hemoglobin A1C: 6.1

## 2016-09-09 LAB — CBC WITH DIFFERENTIAL/PLATELET
BASOS ABS: 0 {cells}/uL (ref 0–200)
Basophils Relative: 0 %
EOS PCT: 3 %
Eosinophils Absolute: 219 cells/uL (ref 15–500)
HCT: 41.3 % (ref 35.0–45.0)
Hemoglobin: 12.6 g/dL (ref 11.7–15.5)
Lymphocytes Relative: 48 %
Lymphs Abs: 3504 cells/uL (ref 850–3900)
MCH: 22.7 pg — AB (ref 27.0–33.0)
MCHC: 30.5 g/dL — ABNORMAL LOW (ref 32.0–36.0)
MCV: 74.5 fL — ABNORMAL LOW (ref 80.0–100.0)
MONOS PCT: 8 %
MPV: 9.4 fL (ref 7.5–12.5)
Monocytes Absolute: 584 cells/uL (ref 200–950)
NEUTROS ABS: 2993 {cells}/uL (ref 1500–7800)
Neutrophils Relative %: 41 %
Platelets: 256 10*3/uL (ref 140–400)
RBC: 5.54 MIL/uL — ABNORMAL HIGH (ref 3.80–5.10)
RDW: 15.9 % — ABNORMAL HIGH (ref 11.0–15.0)
WBC: 7.3 10*3/uL (ref 3.8–10.8)

## 2016-09-09 LAB — COMPLETE METABOLIC PANEL WITH GFR
ALBUMIN: 4.1 g/dL (ref 3.6–5.1)
ALK PHOS: 62 U/L (ref 33–130)
ALT: 12 U/L (ref 6–29)
AST: 19 U/L (ref 10–35)
BUN: 13 mg/dL (ref 7–25)
CALCIUM: 9.2 mg/dL (ref 8.6–10.4)
CO2: 25 mmol/L (ref 20–31)
Chloride: 107 mmol/L (ref 98–110)
Creat: 1.04 mg/dL — ABNORMAL HIGH (ref 0.50–0.99)
GFR, EST AFRICAN AMERICAN: 67 mL/min (ref >=60)
GFR, EST NON AFRICAN AMERICAN: 59 mL/min — AB (ref >=60)
GLUCOSE: 72 mg/dL (ref 65–99)
Potassium: 4.3 mmol/L (ref 3.5–5.3)
Sodium: 139 mmol/L (ref 135–146)
TOTAL PROTEIN: 7.1 g/dL (ref 6.1–8.1)
Total Bilirubin: 0.3 mg/dL (ref 0.2–1.2)

## 2016-09-09 LAB — LIPID PANEL
CHOL/HDL RATIO: 4 ratio (ref <5.0)
CHOLESTEROL: 194 mg/dL (ref <200)
HDL: 49 mg/dL — ABNORMAL LOW (ref >50)
LDL Cholesterol: 117 mg/dL — ABNORMAL HIGH (ref <100)
Triglycerides: 140 mg/dL (ref <150)
VLDL: 28 mg/dL (ref <30)

## 2016-09-09 MED ORDER — MELOXICAM 15 MG PO TABS: 15.0000 mg | ORAL_TABLET | Freq: Every day | ORAL | 2 refills | Status: DC

## 2016-09-09 NOTE — Patient Instructions (Signed)
Schedule to come back for PAP smear soon

## 2016-09-09 NOTE — Progress Notes (Signed)
Nichole Best Ijames, is a 61 y.o. female  ZOX:[id number]:[id number]  WJX:[id number]  DOB - May 16, 1956  CC:  Chief Complaint  Patient presents with  . Diabetes    has been stable  . toe pain    big toe still hurts  . Knee Pain    arthritis issues       HPI: Nichole Best Freimark is a 61 y.o. female here for follow-up chronic conditions. Back in 2015 she had an A1C of 6.6 but since that time has always been the pre-diabetes range. Today her A1C is 6.1.She is on no diabetic medications. She only complains today of pain in her big toes and knees. She has a history of COPD and gout.   Health maintenance: She declines flu and pneumonia vaccines today. Will reschedule for a PAP in near future.  Allergies  Allergen Reactions  . Codeine Other (See Comments)    seizures  . Lisinopril Cough   Past Medical History:  Diagnosis Date  . Chronic cough   . COPD (chronic obstructive pulmonary disease) (HCC)   . Diabetes mellitus without complication (HCC)   . Gout   . Hypertension   . Vertigo    Current Outpatient Prescriptions on File Prior to Visit  Medication Sig Dispense Refill  . Albuterol Sulfate (PROVENTIL HFA IN) Inhale 1 puff into the lungs 2 (two) times daily as needed (shortness of breath).     . losartan (COZAAR) 50 MG tablet Take 1 tablet (50 mg total) by mouth daily. 30 tablet 11  . meclizine (ANTIVERT) 25 MG tablet Take 1 tablet (25 mg total) by mouth 3 (three) times daily as needed for dizziness. 60 tablet 11   No current facility-administered medications on file prior to visit.    History reviewed. No pertinent family history. Social History   Social History  . Marital status: Legally Separated    Spouse name: N/A  . Number of children: N/A  . Years of education: N/A   Occupational History  . Not on file.   Social History Main Topics  . Smoking status: Current Every Day Smoker    Packs/day: 0.25    Types: Cigarettes  . Smokeless tobacco: Never Used     Comment: Pt has been  smoking since age 61  . Alcohol use 0.6 oz/week    1 Cans of beer per week     Comment: occ  . Drug use: No  . Sexual activity: Not on file   Other Topics Concern  . Not on file   Social History Narrative  . No narrative on file    Review of Systems: Constitutional: Negative. + for hot flashes Skin: + for hand rash (see derm).  HENT: Negative  Eyes: Negative  Neck: Negative Respiratory: Negative. Some shortness of breath with exertion Cardiovascular: Negative Gastrointestinal: Negative Genitourinary: Negative  Musculoskeletal: + for knee pain and pain in great toes Neurological: Negative for Hematological: Negative  Psychiatric/Behavioral: Negative    Objective:   Vitals:   09/09/16 1306  BP: (!) 145/92  Pulse: 75  Resp: 12  Temp: 97.6 F (36.4 C)    Physical Exam: Constitutional: Patient appears well-developed and well-nourished. No distress. HENT: Normocephalic, atraumatic, External right and left ear normal. Oropharynx is clear and moist.  Eyes: Conjunctivae and EOM are normal. PERRLA, no scleral icterus. Neck: Normal ROM. Neck supple. No lymphadenopathy, No thyromegaly. CVS: RRR, S1/S2 +, no murmurs, no gallops, no rubs Pulmonary: Effort and breath sounds normal, no stridor, rhonchi, wheezes, rales.  Abdominal: Soft. Normoactive BS,, no distension, tenderness, rebound or guarding.  Musculoskeletal: Normal range of motion. No edema and no tenderness. Great toenails thickened and deformed.  Neuro: Alert.Normal muscle tone coordination. Non-focal Skin: Skin is warm and dry. No rash noted. Not diaphoretic. No erythema. No pallor. Psychiatric: Normal mood and affect. Behavior, judgment, thought content normal.  Lab Results  Component Value Date   WBC 6.8 06/06/2016   HGB 12.5 06/06/2016   HCT 40.6 06/06/2016   MCV 76.5 (L) 06/06/2016   PLT 234 06/06/2016   Lab Results  Component Value Date   CREATININE 0.92 12/30/2015   BUN 10 12/30/2015   NA 138  12/30/2015   K 4.2 12/30/2015   CL 103 12/30/2015   CO2 29 12/30/2015    Lab Results  Component Value Date   HGBA1C 6.1 03/04/2016   Lipid Panel     Component Value Date/Time   CHOL 119 11/19/2015 0522   TRIG 88 11/19/2015 0522   HDL 26 (L) 11/19/2015 0522   CHOLHDL 4.6 11/19/2015 0522   VLDL 18 11/19/2015 0522   LDLCALC 75 11/19/2015 0522        Assessment and plan:   1. Essential hypertension  - COMPLETE METABOLIC PANEL WITH GFR - CBC with Differential - Lipid panel - Ambulatory referral to Podiatry  2. Knee pain -meloxicam 15 mg, one po q day.    Return in about 6 months (around 03/09/2017) for HTN.  The patient was given clear instructions to go to ER or return to medical center if symptoms don't improve, worsen or new problems develop. The patient verbalized understanding.    Henrietta Hoover FNP  09/09/2016, 1:47 PM

## 2016-09-17 ENCOUNTER — Ambulatory Visit: Payer: Self-pay | Admitting: Podiatry

## 2016-12-24 ENCOUNTER — Other Ambulatory Visit: Payer: Self-pay | Admitting: *Deleted

## 2016-12-24 NOTE — Telephone Encounter (Signed)
Medication Adjustments/Labs and Tests Ordered: Current medicines are reviewed at length with the patient today.  Concerns regarding medicines are outlined above.  Medication changes, Labs and Tests ordered today are listed in the Patient Instructions below. Patient Instructions  Medication Instructions:  The current medical regimen is effective;  continue present plan and medications.  Follow-Up: Follow up as needed with Dr Anne FuSkains.  Thank you for choosing [facility]!!        Signed, Donato SchultzMark Skains, MD  06/04/2016 1:42 PM    [facility] [address], [place], [place]  [address] Phone: [phone]; Fax: [phone]     Patient Instructions by Sharin GraveFleming, Pamela J, RN at 06/04/2016 1:30 PM

## 2016-12-31 ENCOUNTER — Telehealth: Payer: Self-pay

## 2016-12-31 MED ORDER — LOSARTAN POTASSIUM 50 MG PO TABS: 50.0000 mg | ORAL_TABLET | Freq: Every day | ORAL | 0 refills | Status: DC

## 2016-12-31 MED ORDER — MECLIZINE HCL 25 MG PO TABS: 25.0000 mg | ORAL_TABLET | Freq: Three times a day (TID) | ORAL | 0 refills | Status: DC | PRN

## 2016-12-31 NOTE — Telephone Encounter (Signed)
Please call patient to schedule an appointment for next month. I have refilled both medications requested for 1 month only.  Godfrey PickKimberly S. Tiburcio PeaHarris, MSN, FNP-C [facility]  [address] [place], [place] [address] [phone]

## 2017-01-06 ENCOUNTER — Ambulatory Visit (HOSPITAL_COMMUNITY)
Admission: RE | Admit: 2017-01-06 | Discharge: 2017-01-06 | Disposition: A | Discharge status: Home / Self Care | Payer: Medicare Other | Source: Ambulatory Visit | Attending: Family Medicine | Admitting: Family Medicine

## 2017-01-06 ENCOUNTER — Encounter: Payer: Self-pay | Admitting: Family Medicine

## 2017-01-06 ENCOUNTER — Ambulatory Visit (INDEPENDENT_AMBULATORY_CARE_PROVIDER_SITE_OTHER): Payer: Medicare Other | Admitting: Family Medicine

## 2017-01-06 VITALS — BP 140/80 | HR 84 | Temp 98.1°F | Resp 14 | Ht 66.0 in | Wt 172.6 lb

## 2017-01-06 DIAGNOSIS — M25551 Pain in right hip: Secondary | ICD-10-CM | POA: Diagnosis not present

## 2017-01-06 DIAGNOSIS — M25561 Pain in right knee: Secondary | ICD-10-CM | POA: Insufficient documentation

## 2017-01-06 DIAGNOSIS — M25552 Pain in left hip: Secondary | ICD-10-CM

## 2017-01-06 DIAGNOSIS — M25562 Pain in left knee: Secondary | ICD-10-CM | POA: Insufficient documentation

## 2017-01-06 DIAGNOSIS — G8929 Other chronic pain: Secondary | ICD-10-CM

## 2017-01-06 LAB — POCT URINALYSIS DIP (DEVICE)
Bilirubin Urine: NEGATIVE
Glucose, UA: NEGATIVE mg/dL
KETONES UR: NEGATIVE mg/dL
Nitrite: NEGATIVE
PH: 6 (ref 5.0–8.0)
PROTEIN: NEGATIVE mg/dL
Specific Gravity, Urine: 1.02 (ref 1.005–1.030)
Urobilinogen, UA: 1 mg/dL (ref 0.0–1.0)

## 2017-01-06 MED ORDER — MELOXICAM 15 MG PO TABS: 15.0000 mg | ORAL_TABLET | Freq: Every day | ORAL | 1 refills | Status: AC

## 2017-01-06 NOTE — Patient Instructions (Signed)
I have refilled your Meloxicam 15 mg daily for hip and knee pain.    Hip Pain The hip is the joint between the upper legs and the lower pelvis. The bones, cartilage, tendons, and muscles of your hip joint support your body and allow you to move around. Hip pain can range from a minor ache to severe pain in one or both of your hips. The pain may be felt on the inside of the hip joint near the groin, or the outside near the buttocks and upper thigh. You may also have swelling or stiffness. Follow these instructions at home: Managing pain, stiffness, and swelling  If directed, apply ice to the injured area. ? Put ice in a plastic bag. ? Place a towel between your skin and the bag. ? Leave the ice on for 20 minutes, 2-3 times a day  Sleep with a pillow between your legs on your most comfortable side.  Avoid any activities that cause pain. General instructions  Take over-the-counter and prescription medicines only as told by your health care provider.  Do any exercises as told by your health care provider.  Record the following: ? How often you have hip pain. ? The location of your pain. ? What the pain feels like. ? What makes the pain worse.  Keep all follow-up visits as told by your health care provider. This is important. Contact a health care provider if:  You cannot put weight on your leg.  Your pain or swelling continues or gets worse after one week.  It gets harder to walk.  You have a fever. Get help right away if:  You fall.  You have a sudden increase in pain and swelling in your hip.  Your hip is red or swollen or very tender to touch. Summary  Hip pain can range from a minor ache to severe pain in one or both of your hips.  The pain may be felt on the inside of the hip joint near the groin, or the outside near the buttocks and upper thigh.  Avoid any activities that cause pain.  Record how often you have hip pain, the location of the pain, what makes it  worse and what it feels like. This information is not intended to replace advice given to you by your health care provider. Make sure you discuss any questions you have with your health care provider. Document Released: 12/26/2009 Document Revised: 06/10/2016 Document Reviewed: 06/10/2016 Elsevier Interactive Patient Education  2017 [employer]

## 2017-01-06 NOTE — Progress Notes (Signed)
Patient ID: Nichole Best, female    DOB: Oct 15, 1955, 61 y.o.   MRN: [id number]  PCP: Bing Neighbors, FNP  Chief Complaint  Patient presents with  . Leg Pain  . Dizziness    Subjective:  HPI Nichole Best is a 61 y.o. female presents for evaluation of bilateral leg pain. Right knee swelling and bilateral chronic leg knee. [name] reports that her pain starts at the top of her hip and radiates down to both of her knee bilaterally. She reports she has had multiple falls over the last several months just due to the weakness and the intensity of the pain both of her legs. In the past she has had cortisone shots which were helpful in reducing pain in her knee but she stopped going due to the cost. She reports a previous diagnosis of osteoarthritis however imaging on file does not substantiate that diagnosis. At present she is taking over-the-counter ibuprofen to relieve her pain with mild relief. She reports she is an escape. In addition to WD-40 spray when her knees are hurting at its worst. She denies any episodic skin irritation or burns. She had been previously prescribed meloxicam but she reports improved her symptoms however she ran out of that prescription. . Social History   Social History  . Marital status: Legally Separated    Spouse name: N/A  . Number of children: N/A  . Years of education: N/A   Occupational History  . Not on file.   Social History Main Topics  . Smoking status: Current Every Day Smoker    Packs/day: 0.25    Types: Cigarettes  . Smokeless tobacco: Never Used     Comment: Pt has been smoking since age 82  . Alcohol use 0.6 oz/week    1 Cans of beer per week     Comment: occ  . Drug use: No  . Sexual activity: Not on file   Other Topics Concern  . Not on file   Social History Narrative  . No narrative on file   Review of Systems See history of present illness Patient Active Problem List   Diagnosis Date Noted  . Chest pain at rest 11/19/2015   . Chest pain with moderate risk for cardiac etiology 11/19/2015  . Pre-diabetes 11/19/2015  . Other screening mammogram 12/21/2013  . Weight gain 12/21/2013  . History of gout 12/21/2013  . Pain of left great toe 12/21/2013  . Gout 12/21/2013  . Essential hypertension 12/21/2013    Allergies  Allergen Reactions  . Codeine Other (See Comments)    seizures  . Lisinopril Cough    Prior to Admission medications   Medication Sig Start Date End Date Taking? Authorizing Provider  Albuterol Sulfate (PROVENTIL HFA IN) Inhale 1 puff into the lungs 2 (two) times daily as needed (shortness of breath).     [provider]  losartan (COZAAR) 50 MG tablet Take 1 tablet (50 mg total) by mouth daily. Patient not taking: Reported on 01/06/2017 12/31/16   Bing Neighbors, FNP  meclizine (ANTIVERT) 25 MG tablet Take 1 tablet (25 mg total) by mouth 3 (three) times daily as needed for dizziness. Patient not taking: Reported on 01/06/2017 12/31/16   Bing Neighbors, FNP  meloxicam (MOBIC) 15 MG tablet Take 1 tablet (15 mg total) by mouth daily. Patient not taking: Reported on 01/06/2017 09/09/16   Henrietta Hoover, NP    Past Medical, Surgical Family and Social History reviewed and updated.  Objective:   Today's Vitals   01/06/17 1307  BP: 140/80  Pulse: 84  Resp: 14  Temp: 98.1 F (36.7 C)  TempSrc: Oral  SpO2: 95%  Weight: 172 lb 9.6 oz (78.3 kg)  Height: 5\' 6"  (1.676 m)    Wt Readings from Last 3 Encounters:  01/06/17 172 lb 9.6 oz (78.3 kg)  09/09/16 175 lb (79.4 kg)  06/06/16 166 lb (75.3 kg)    Physical Exam  Constitutional: She is oriented to person, place, and time. She appears well-developed and well-nourished.  HENT:  Head: Normocephalic and atraumatic.  Eyes: Conjunctivae are normal. Pupils are equal, round, and reactive to light.  Neck: Normal range of motion. Neck supple.  Cardiovascular: Normal rate, regular rhythm, normal heart sounds and intact distal  pulses.   Pulmonary/Chest: Effort normal and breath sounds normal.  Musculoskeletal: She exhibits no edema or tenderness.  No re-producible tenderness noted on exam. Patient has complete range of motion of hips and knees with ambulation and active range of motion.  Neurological: She is alert and oriented to person, place, and time.  Skin: Skin is warm and dry.  Psychiatric: She has a normal mood and affect. Her behavior is normal. Judgment and thought content normal.   Assessment & Plan:  1. Bilateral hip pain - DG HIPS BILAT WITH PELVIS 3-4 VIEWS; Future 2. Chronic pain of left knee - DG Knee Complete 4 Views Left; Future 3. Chronic pain of right knee - DG Knee Complete 4 Views Right; Future  Monia  bilateral hip and knee x-rays were all both unremarkable. Start gabapentin 300 mg 3 times daily as needed for leg pain. Ambulatory referral to orthopedic surgery for further evaluation.   RTC: In 1 week for follow-up of leg pain.   Godfrey PickKimberly S. Tiburcio PeaHarris, MSN, FNP-C [facility]  [address] [place], [place] [address] [phone]

## 2017-01-07 ENCOUNTER — Telehealth: Payer: Self-pay

## 2017-01-07 ENCOUNTER — Other Ambulatory Visit: Payer: Self-pay | Admitting: Family Medicine

## 2017-01-07 DIAGNOSIS — M25551 Pain in right hip: Secondary | ICD-10-CM

## 2017-01-07 DIAGNOSIS — M25552 Pain in left hip: Principal | ICD-10-CM

## 2017-01-07 MED ORDER — GABAPENTIN 300 MG PO CAPS: 300.0000 mg | ORAL_CAPSULE | Freq: Three times a day (TID) | ORAL | 0 refills | Status: DC | PRN

## 2017-01-07 NOTE — Telephone Encounter (Signed)
-----   Message from Bing NeighborsKimberly S Harris, FNP sent at 01/06/2017 11:24 PM EDT ----- Advised patient that x-rays of her hips and knees were negative for any chronic or acute findings. If she is still experiencing pain I'll be happy to as I mentioned previously for her to orthopedics for further evaluation and or prescribe her some gabapentin.  Godfrey PickKimberly S. Tiburcio PeaHarris, MSN, FNP-C [facility]  [address] [place], [place] [address] [phone]

## 2017-01-07 NOTE — Telephone Encounter (Signed)
Patient would like to have the Gabapentin sent into [facility] and would also like to be referred to ortho.

## 2017-01-09 ENCOUNTER — Other Ambulatory Visit: Payer: Self-pay | Admitting: Family Medicine

## 2017-01-10 MED ORDER — MECLIZINE HCL 25 MG PO TABS: 25.0000 mg | ORAL_TABLET | Freq: Three times a day (TID) | ORAL | 0 refills | Status: AC | PRN

## 2017-01-10 MED ORDER — LOSARTAN POTASSIUM 50 MG PO TABS: 50.0000 mg | ORAL_TABLET | Freq: Every day | ORAL | 0 refills | Status: DC

## 2017-01-17 ENCOUNTER — Ambulatory Visit (INDEPENDENT_AMBULATORY_CARE_PROVIDER_SITE_OTHER): Payer: Medicare Other | Admitting: Family Medicine

## 2017-01-17 ENCOUNTER — Encounter: Payer: Self-pay | Admitting: Family Medicine

## 2017-01-17 VITALS — BP 142/90 | HR 70 | Temp 98.0°F | Resp 14 | Ht 66.0 in | Wt 172.0 lb

## 2017-01-17 DIAGNOSIS — I10 Essential (primary) hypertension: Secondary | ICD-10-CM | POA: Diagnosis not present

## 2017-01-17 DIAGNOSIS — M25562 Pain in left knee: Secondary | ICD-10-CM | POA: Diagnosis not present

## 2017-01-17 DIAGNOSIS — M25551 Pain in right hip: Secondary | ICD-10-CM | POA: Diagnosis not present

## 2017-01-17 DIAGNOSIS — M25552 Pain in left hip: Secondary | ICD-10-CM | POA: Diagnosis not present

## 2017-01-17 DIAGNOSIS — G8929 Other chronic pain: Secondary | ICD-10-CM

## 2017-01-17 DIAGNOSIS — M25561 Pain in right knee: Secondary | ICD-10-CM

## 2017-01-17 MED ORDER — LOSARTAN POTASSIUM 50 MG PO TABS: 50.0000 mg | ORAL_TABLET | Freq: Every day | ORAL | 0 refills | Status: AC

## 2017-01-17 MED ORDER — GABAPENTIN 300 MG PO CAPS: 300.0000 mg | ORAL_CAPSULE | Freq: Three times a day (TID) | ORAL | 0 refills | Status: AC | PRN

## 2017-01-17 NOTE — Patient Instructions (Signed)
I have e-prescribed your blood pressure medication and Gabapentin to [facility].   Hypertension Hypertension is another name for high blood pressure. High blood pressure forces your heart to work harder to pump blood. This can cause problems over time. There are two numbers in a blood pressure reading. There is a top number (systolic) over a bottom number (diastolic). It is best to have a blood pressure below 120/80. Healthy choices can help lower your blood pressure. You may need medicine to help lower your blood pressure if:  Your blood pressure cannot be lowered with healthy choices.  Your blood pressure is higher than 130/80.  Follow these instructions at home: Eating and drinking  If directed, follow the DASH eating plan. This diet includes: ? Filling half of your plate at each meal with fruits and vegetables. ? Filling one quarter of your plate at each meal with whole grains. Whole grains include whole wheat pasta, brown rice, and whole grain bread. ? Eating or drinking low-fat dairy products, such as skim milk or low-fat yogurt. ? Filling one quarter of your plate at each meal with low-fat (lean) proteins. Low-fat proteins include fish, skinless chicken, eggs, beans, and tofu. ? Avoiding fatty meat, cured and processed meat, or chicken with skin. ? Avoiding premade or processed food.  Eat less than 1,500 mg of salt (sodium) a day.  Limit alcohol use to no more than 1 drink a day for nonpregnant women and 2 drinks a day for men. One drink equals 12 oz of beer, 5 oz of wine, or 1 oz of hard liquor. Lifestyle  Work with your doctor to stay at a healthy weight or to lose weight. Ask your doctor what the best weight is for you.  Get at least 30 minutes of exercise that causes your heart to beat faster (aerobic exercise) most days of the week. This may include walking, swimming, or biking.  Get at least 30 minutes of exercise that strengthens your muscles (resistance exercise) at least  3 days a week. This may include lifting weights or pilates.  Do not use any products that contain nicotine or tobacco. This includes cigarettes and e-cigarettes. If you need help quitting, ask your doctor.  Check your blood pressure at home as told by your doctor.  Keep all follow-up visits as told by your doctor. This is important. Medicines  Take over-the-counter and prescription medicines only as told by your doctor. Follow directions carefully.  Do not skip doses of blood pressure medicine. The medicine does not work as well if you skip doses. Skipping doses also puts you at risk for problems.  Ask your doctor about side effects or reactions to medicines that you should watch for. Contact a doctor if:  You think you are having a reaction to the medicine you are taking.  You have headaches that keep coming back (recurring).  You feel dizzy.  You have swelling in your ankles.  You have trouble with your vision. Get help right away if:  You get a very bad headache.  You start to feel confused.  You feel weak or numb.  You feel faint.  You get very bad pain in your: ? Chest. ? Belly (abdomen).  You throw up (vomit) more than once.  You have trouble breathing. Summary  Hypertension is another name for high blood pressure.  Making healthy choices can help lower blood pressure. If your blood pressure cannot be controlled with healthy choices, you may need to take medicine. This information  This information is not intended to replace advice given to you by your health care provider. Make sure you discuss any questions you have with your health care provider. Document Released: 12/25/2007 Document Revised: 06/05/2016 Document Reviewed: 06/05/2016 Elsevier Interactive Patient Education  2018 Elsevier Inc.  

## 2017-01-17 NOTE — Progress Notes (Signed)
Patient ID: Nichole Best, female    DOB: [birth year], 61 y.o.   MRN: [id number]  PCP: Bing Neighbors, FNP  Chief Complaint  Patient presents with  . Follow-up    leg pain and blood pressure    Subjective:  HPI Nichole Best is a 61 y.o. female presents for 1 week follow-up of leg pain and hypertension.  Bilateral leg pain Nichole Best was seen in office on 01/06/2017 with a complaint of bilateral hip pain. Images was ordered of her bilateral hips, and right and left knee, which were all normal. She was placed on meloxicam for anti-inflammatory treatment and reports that medication improved pain however she is still experiencing an occasional sharp or shooting like pain radiating from her hips to knees with prolonged sitting and occasional walking. She has not started the gabapentin originally ordered.   Hypertension Nichole Best reports no home monitoring of blood pressure. Reports adherence to blood pressure medications, and has tolerated losartan 50 mg for quite some time without adverse effects. She has not received medication previously e-prescribed on 12/31/16 due to problems with her prescription coverage and medications need to be sent to [facility]. Reports efforts to adhere to low sodium diet. She is a current smoker. Denies any episodes of dizziness,headaches, shortness of breath, or chest pain.  Social History   Social History  . Marital status: Legally Separated    Spouse name: N/A  . Number of children: N/A  . Years of education: N/A   Occupational History  . Not on file.   Social History Main Topics  . Smoking status: Current Every Day Smoker    Packs/day: 0.25    Types: Cigarettes  . Smokeless tobacco: Never Used     Comment: Pt has been smoking since age 60  . Alcohol use 0.6 oz/week    1 Cans of beer per week     Comment: occ  . Drug use: No  . Sexual activity: Not on file   Other Topics Concern  . Not on file   Social History Narrative  . No narrative  on file    No family history on file. Review of Systems See history of present illness  Patient Active Problem List   Diagnosis Date Noted  . Chest pain at rest 11/19/2015  . Chest pain with moderate risk for cardiac etiology 11/19/2015  . Pre-diabetes 11/19/2015  . Other screening mammogram 12/21/2013  . Weight gain 12/21/2013  . History of gout 12/21/2013  . Pain of left great toe 12/21/2013  . Gout 12/21/2013  . Essential hypertension 12/21/2013    Allergies  Allergen Reactions  . Codeine Other (See Comments)    seizures  . Lisinopril Cough    Prior to Admission medications   Medication Sig Start Date End Date Taking? Authorizing Provider  Albuterol Sulfate (PROVENTIL HFA IN) Inhale 1 puff into the lungs 2 (two) times daily as needed (shortness of breath).     [provider]  gabapentin (NEURONTIN) 300 MG capsule Take 1 capsule (300 mg total) by mouth 3 (three) times daily as needed. 01/07/17   Bing Neighbors, FNP  losartan (COZAAR) 50 MG tablet Take 1 tablet (50 mg total) by mouth daily. 01/10/17   Bing Neighbors, FNP  meclizine (ANTIVERT) 25 MG tablet Take 1 tablet (25 mg total) by mouth 3 (three) times daily as needed for dizziness. 01/10/17   Bing Neighbors, FNP  meloxicam (MOBIC) 15 MG tablet Take 1 tablet (15 mg total)  by mouth daily. 01/06/17   Bing NeighborsHarris, Samah Lapiana S, FNP    Past Medical, Surgical Family and Social History reviewed and updated.    Objective:   Vitals:   01/17/17 1329  BP: (!) 142/90  Pulse: 70  Resp: 14  Temp: 98 F (36.7 C)     Wt Readings from Last 3 Encounters:  01/06/17 172 lb 9.6 oz (78.3 kg)  09/09/16 175 lb (79.4 kg)  06/06/16 166 lb (75.3 kg)    Physical Exam  Constitutional: She is oriented to person, place, and time. She appears well-developed and well-nourished.  Cardiovascular: Normal rate, regular rhythm, normal heart sounds and intact distal pulses.   Pulmonary/Chest: Effort normal and breath sounds  normal.  Musculoskeletal: Normal range of motion.  No reproducible leg or hip pain. No bony tenderness noted with palpation of the hip, posterior and anterior upper or lower extremities  Neurological: She is alert and oriented to person, place, and time.  Skin: Skin is warm and dry.  Psychiatric: She has a normal mood and affect. Her behavior is normal. Judgment and thought content normal.   Assessment & Plan:  1. Essential hypertension, elevated today, she has not taken blood pressure medication. -Re prescribed Losartan 50 mg once daily to [facility] -Encouraged patient to occasionally check BP at a local retailer store and keep a log of readings  2. Bilateral hip pain 3. Chronic pain of left knee 4. Chronic pain of right knee -Gabapentin 300 mg TID PRN, for pain. -Continue Meloxicam 15 mg daily as needed for knee pain.  RTC: CMP, CBC, lipid panel, thyroid function labs, Pap, discuss colonoscopy, repeat A1c  Godfrey PickKimberly S. Tiburcio PeaHarris, MSN, FNP-C [facility]  [address] [place], [place] [address] [phone]

## 2017-02-03 ENCOUNTER — Ambulatory Visit (INDEPENDENT_AMBULATORY_CARE_PROVIDER_SITE_OTHER): Payer: Self-pay

## 2017-02-03 ENCOUNTER — Ambulatory Visit (INDEPENDENT_AMBULATORY_CARE_PROVIDER_SITE_OTHER): Payer: Medicare Other

## 2017-02-03 ENCOUNTER — Encounter (INDEPENDENT_AMBULATORY_CARE_PROVIDER_SITE_OTHER): Payer: Self-pay | Admitting: Orthopaedic Surgery

## 2017-02-03 ENCOUNTER — Ambulatory Visit (INDEPENDENT_AMBULATORY_CARE_PROVIDER_SITE_OTHER): Payer: Medicare Other | Admitting: Orthopaedic Surgery

## 2017-02-03 ENCOUNTER — Other Ambulatory Visit (INDEPENDENT_AMBULATORY_CARE_PROVIDER_SITE_OTHER): Payer: Self-pay

## 2017-02-03 VITALS — BP 131/84 | HR 91 | Resp 14 | Ht 66.0 in | Wt 172.0 lb

## 2017-02-03 DIAGNOSIS — M25551 Pain in right hip: Secondary | ICD-10-CM

## 2017-02-03 DIAGNOSIS — M25552 Pain in left hip: Secondary | ICD-10-CM

## 2017-02-03 DIAGNOSIS — M5440 Lumbago with sciatica, unspecified side: Secondary | ICD-10-CM | POA: Diagnosis not present

## 2017-02-03 NOTE — Progress Notes (Signed)
Office Visit Note   Patient: Nichole Best           Date of Birth: January 18, 1956           MRN: [id number] Visit Date: 02/03/2017              Requested by: Bing Neighbors, FNP [address] [place], [place] [address] PCP: Bing Neighbors, FNP   Assessment & Plan: Visit Diagnoses:  1. Pain in right hip   2. Low back pain with sciatica, sciatica laterality unspecified, unspecified back pain laterality, unspecified chronicity   3. 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 I believe this will explain her pain  Follow-Up Instructions: Return MRI pelvis.   Orders:  Orders Placed This Encounter  Procedures  . XR Lumbar Spine 2-3 Views  . XR HIPS BILAT W OR W/O PELVIS 3-4 VIEWS   No orders of the defined types were placed in this encounter.     Procedures: No procedures performed   Clinical Data: No additional findings.   Subjective: Chief Complaint  Patient presents with  . Right Hip - Pain  . Left Hip - Pain  Nichole Best complaining of pain along the iliac crests bilaterally referred to her groin and anterior aspect of both thighs. 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 She's had some difficulty bearing weight. Denies any numbness or tingling into either leg or foot. She has tried NSAIDs with only minimal relief of her pain. No history of injury or trauma  HPI  Review of Systems   Objective: Vital Signs: BP 131/84   Pulse 91   Resp 14   Ht 5\' 6"  (1.676 m)   Wt 172 lb (78 kg)   LMP  (LMP Unknown)   BMI 27.76 kg/m   Physical Exam  Ortho Exam some pain on extreme of external rotation of both hips more left than right. No  thigh pain. No obvious atrophy of her left compared to her right lower extremity. No effusion left knee area no particular joint pain. Mild patella crepitation and was not uncomfortable. No swelling distally neurovascular exam intact. Straight leg raise negative bilaterally.  Best Comments:  No Best comments available.  Imaging: Xr Hips Bilat W Or W/o Pelvis 3-4 Views  Result Date: 02/03/2017 AP the pelvis demonstrates some degenerative changes in both hips. There are subchondral cysts particularly on the left side in the femoral head superiorly and medially. The joint space appears to be well maintained. Some mild sclerosis of the acetabulum. No ectopic calcification. Minimal cyst formation on the right. The joint space was well maintained. Very few subchondral cysts. Both are consistent with probable osteoarthritis of the hips  Xr Lumbar Spine 2-3 Views  Result Date: 02/03/2017 Films of the lumbar spine obtained in the AP lateral projections. The disc spaces are well maintained. No evidence of listhesis. Minimal facet arthropathy at L4-5 and L5-S1. Some anterior vertebral osteophytes. Very early calcification of the abdominal aorta. No obvious aneurysmal dilatation    PMFS History: Patient Active Problem List   Diagnosis Date Noted  . Chest pain at rest 11/19/2015  . Chest pain with moderate risk for cardiac etiology  11/19/2015  . Pre-diabetes 11/19/2015  . Other screening mammogram 12/21/2013  . Weight gain 12/21/2013  . History of gout 12/21/2013  . Pain of left great toe 12/21/2013  . Gout 12/21/2013  . Essential hypertension 12/21/2013   Past Medical History:  Diagnosis Date  . Chronic cough   . COPD (chronic obstructive pulmonary disease) (HCC)   . Diabetes mellitus without complication (HCC)   . Gout   . Hypertension   . Vertigo     No family history on file.  Past Surgical History:  Procedure Laterality Date  . HAND SURGERY  1977   Social History    Occupational History  . Not on file.   Social History Main Topics  . Smoking status: Never Smoker  . Smokeless tobacco: Never Used     Comment: Pt has been smoking since age 61  . Alcohol use 0.6 oz/week    1 Cans of beer per week     Comment: occ  . Drug use: No  . Sexual activity: Not on file     Valeria BatmanPeter W Whitfield, MD   Note - This record has been created using [employer] software.  Chart creation errors have been sought, but may not always  have been located. Such creation errors do not reflect on  the standard of medical care.

## 2017-03-11 ENCOUNTER — Ambulatory Visit: Admitting: Family Medicine

## 2017-04-15 ENCOUNTER — Ambulatory Visit
Admit: 2017-04-15 | Discharge: 2017-04-15 | Payer: PRIVATE HEALTH INSURANCE | Attending: Family Medicine | Primary: Family Medicine

## 2017-04-15 DIAGNOSIS — I10 Essential (primary) hypertension: Secondary | ICD-10-CM

## 2017-04-15 MED ORDER — LOSARTAN 50 MG TAB
50 mg | ORAL_TABLET | Freq: Every day | ORAL | 3 refills | Status: DC
Start: 2017-04-15 — End: 2018-05-26

## 2017-04-15 NOTE — Patient Instructions (Signed)
Please contact our office if you have any questions about your visit today.

## 2017-04-15 NOTE — Progress Notes (Addendum)
Meital Riehl presents today for   Chief Complaint   Patient presents with   ??? New Patient     establishing care today   ??? Hypertension   ??? Depression   ??? Pain (Chronic)     Patient reports having chronic joint pain. Patient states that she was seeing orthopedic in [place] for hip pain.    ??? Medication Refill       Is someone accompanying this pt? no    Is the patient using any DME equipment during OV? no    Patient denies any attempts on suicide. Provider has been notified.   Depression Screening:  PHQ over the last two weeks 04/15/2017   PHQ Not Done Active Diagnosis of Depression or Bipolar Disorder   Little interest or pleasure in doing things More than half the days   Feeling down, depressed, irritable, or hopeless More than half the days   Total Score PHQ 2 4   Trouble falling or staying asleep, or sleeping too much Several days   Feeling tired or having little energy Several days   Poor appetite, weight loss, or overeating Nearly every day   Feeling bad about yourself - or that you are a failure or have let yourself or your family down More than half the days   Trouble concentrating on things such as school, work, reading, or watching TV Nearly every day   Moving or speaking so slowly that other people could have noticed; or the opposite being so fidgety that others notice Nearly every day   Thoughts of being better off dead, or hurting yourself in some way Several days   PHQ 9 Score 18   How difficult have these problems made it for you to do your work, take care of your home and get along with others Somewhat difficult       Learning Assessment:  Learning Assessment 04/15/2017   PRIMARY LEARNER Patient   HIGHEST LEVEL OF EDUCATION - PRIMARY LEARNER  SOME COLLEGE   BARRIERS PRIMARY LEARNER NONE   CO-LEARNER CAREGIVER No   PRIMARY LANGUAGE ENGLISH   LEARNER PREFERENCE PRIMARY DEMONSTRATION   ANSWERED BY patient   RELATIONSHIP SELF       Abuse Screening:  Abuse Screening Questionnaire 04/15/2017    Do you ever feel afraid of your partner? N   Are you in a relationship with someone who physically or mentally threatens you? N   Is it safe for you to go home? Y       Fall Risk  Fall Risk Assessment, last 12 mths 04/15/2017   Able to walk? Yes   Fall in past 12 months? No             Advance Directive:  1. Do you have an advance directive in place? Patient Reply:pending,patient Is getting her paperwork in place.  Patient will provide info when ready.    2. If not, would you like material regarding how to put one in place? Patient Reply: no

## 2017-04-15 NOTE — Progress Notes (Signed)
Chief Complaint   Patient presents with   ??? New Patient     establishing care today. Records available in Care everywhere   ??? Hypertension   ??? Depression   ??? Pain (Chronic)     Patient reports having chronic joint pain. Patient states that she was seeing orthopedic in [place] for hip pain.    ??? Medication Refill     HISTORY OF PRESENT ILLNESS  Kristen Ramsey is a 61 y.o. female.  HPI  Pt here to establish care.  She is from this area but lived out of state for 14 yrs.  She returned to be near her older sister but her sister died 2 wks after pt returned.  She has been dealing with the death of her sister.  In the past, pt's mother had lived with pt.  When her mother died, pt did grieve privately for some time.      Pt has hx of htn and chronic b hip and knee pain.  She was told that she has OA and gouty arthritis.  She reports that she has been having falls over the last several months.   She was on gabapentin and meloxicam but stopped them both because one was making her feel sleepy.    Review of Systems   Constitutional: Negative.    HENT: Negative.    Respiratory: Negative.    Cardiovascular: Negative.    Musculoskeletal: Positive for joint pain.   All other systems reviewed and are negative.      Physical Exam  Physical Exam   Nursing note and vitals reviewed.  Constitutional: She is oriented to person, place, and time. She appears well-developed and well-nourished.   HENT:   Head: Normocephalic and atraumatic.   Right Ear: External ear normal.   Left Ear: External ear normal.   Nose: Nose normal.   Eyes: Conjunctivae and EOM are normal.   Neck: Normal range of motion. Neck supple. No JVD present. Carotid bruit is not present. No thyromegaly present.   Cardiovascular: Normal rate, regular rhythm, normal heart sounds and intact distal pulses.  Exam reveals no gallop and no friction rub.    No murmur heard.  Pulmonary/Chest: Effort normal and breath sounds normal. She has no  wheezes. She has no rhonchi. She has no rales.   Abdominal: Soft. Bowel sounds are normal.   Musculoskeletal: Normal range of motion.   Neurological: She is alert and oriented to person, place, and time. Coordination normal.   Skin: Skin is warm and dry.   Psychiatric: She has a normal mood and affect. Her behavior is normal. Judgment and thought content normal.     ASSESSMENT and PLAN  Diagnoses and all orders for this visit:    1. Essential hypertension  -     losartan (COZAAR) 50 mg tablet; Take 1 Tab by mouth daily.  -     METABOLIC PANEL, COMPREHENSIVE; Future  -     LIPID PANEL; Future  Not at goal.  Will recheck at f/u    2. Impaired fasting glucose  -     METABOLIC PANEL, COMPREHENSIVE; Future  -     LIPID PANEL; Future  -     HEMOGLOBIN A1C WITH EAG; Future    3. Osteoarthritis, unspecified osteoarthritis type, unspecified site  Long discussion re: use of meloxicam as well as use of gabapentin for chronic pain  Pt will start meloxicam every day.  Recommend pt wait 2 wks to resume gaba in order  to assess effect of mobic.  Pt advised that she should try gabapentin qhs to see if it helps with her pain without the grogginess that daytime dosing seems to have caused her.     4. Encounter for screening mammogram for malignant neoplasm of breast  -     MAM MAMMO BI SCREENING INCL CAD; Future    5. Screen for colon cancer  -     [facility] ref [facility]      Follow-up Disposition:  Return in about 4 weeks (around 05/13/2017) for high blood pressure, knee pain.

## 2017-04-17 LAB — LIPID PANEL
CHOLESTEROL/HDL: 3.7 (ref 0.0–5.0)
Cholesterol, total: 183 mg/dL (ref 110–200)
HDL Cholesterol: 49 mg/dL (ref 40–59)
LDL, calculated: 118 mg/dL — ABNORMAL HIGH (ref 50–99)
Triglyceride: 80 mg/dL (ref 40–149)
VLDL, calculated: 16 mg/dL (ref 8–30)

## 2017-04-17 LAB — METABOLIC PANEL, COMPREHENSIVE
A-G Ratio: 1.4 ratio (ref 1.1–2.6)
ALT (SGPT): 8 U/L (ref 5–40)
AST (SGOT): 13 U/L (ref 10–37)
Albumin: 3.9 g/dL (ref 3.5–5.0)
Alk. phosphatase: 70 U/L (ref 40–120)
Anion gap: 8 mmol/L
BUN: 7 mg/dL (ref 6–22)
Bilirubin, total: 0.3 mg/dL (ref 0.2–1.2)
CO2: 30 mmol/L (ref 20–32)
Calcium: 9.1 mg/dL (ref 8.4–10.5)
Chloride: 101 mmol/L (ref 98–110)
Creatinine: 0.9 mg/dL (ref 0.8–1.4)
GFRAA: >60 (ref >60.0)
GFRNA: 60 — ABNORMAL LOW (ref >60.0)
Globulin: 2.8 g/dL (ref 2.0–4.0)
Glucose: 101 mg/dL — ABNORMAL HIGH (ref 70–99)
Potassium: 4 mmol/L (ref 3.5–5.5)
Protein, total: 6.7 g/dL (ref 6.2–8.1)
Sodium: 139 mmol/L (ref 133–145)

## 2017-04-17 LAB — HEMOGLOBIN A1C W/O EAG
AVG GLU: 125 mg/dL — ABNORMAL HIGH (ref 91–123)
Hemoglobin A1c: 6 % — ABNORMAL HIGH (ref 4.8–5.9)

## 2017-04-21 ENCOUNTER — Ambulatory Visit: Admitting: Family Medicine

## 2017-05-27 ENCOUNTER — Institutional Professional Consult (permissible substitution): Admit: 2017-05-27 | Discharge: 2017-05-27 | Payer: PRIVATE HEALTH INSURANCE | Primary: Family Medicine

## 2017-05-27 DIAGNOSIS — Z1211 Encounter for screening for malignant neoplasm of colon: Secondary | ICD-10-CM

## 2017-05-27 NOTE — Progress Notes (Deleted)
Review of Systems - {ros [id number]}

## 2017-05-27 NOTE — Progress Notes (Signed)
Review of Systems   Constitutional: Positive for diaphoresis. Negative for chills, fever, malaise/fatigue and weight loss.        Hot flashes   HENT: Negative.    Eyes: Positive for blurred vision and photophobia. Negative for double vision, pain, discharge and redness.        Vision goes out, patient will be finding a eye dr   Respiratory: Positive for shortness of breath. Negative for cough, hemoptysis, sputum production and wheezing.         On excertion   Cardiovascular: Negative.    Gastrointestinal: Positive for nausea. Negative for abdominal pain, blood in stool, constipation, diarrhea, heartburn, melena and vomiting.   Genitourinary: Negative.    Musculoskeletal: Positive for joint pain. Negative for back pain, falls, myalgias and neck pain.        Waist down   Skin: Positive for itching and rash.        excema hands   Neurological: Positive for headaches. Negative for dizziness, tingling, tremors, sensory change, speech change, focal weakness, seizures, loss of consciousness and weakness.   Endo/Heme/Allergies: Negative for environmental allergies and polydipsia. Bruises/bleeds easily.   Psychiatric/Behavioral: Positive for memory loss. Negative for depression, hallucinations, substance abuse and suicidal ideas. The patient is not nervous/anxious and does not have insomnia.             Colon Screen    Patient: Kristen Ramsey MRN: [id number]  SSN: [id number]    Date of Birth: [birth year]  Age: 61 y.o.  Sex: female        Subjective:   Kristen Ramsey was referred by her PCP, Albin Felling, MD.  Patient referred for colonoscopy for   Screening colonoscopy. Patient denies rectal pain or bleeding.  Abdominal surgeries as described below, specifically tubal ligation.  Family history as described below, specifically none. Patient has never had a colonoscopy.    Allergies   Allergen Reactions   ??? Codeine Seizures   ??? Lisinopril Cough   ??? Tomato Hives       Past Medical History:   Diagnosis Date   ??? Arthritis     ??? Chronic obstructive pulmonary disease ([place])    ??? Depression    ??? Elevated glucose    ??? Glaucoma    ??? Gout    ??? HTN (hypertension)    ??? Joint pain      Past Surgical History:   Procedure Laterality Date   ??? HX ORTHOPAEDIC      Left hand repair due to work injury   ??? HX TUBAL LIGATION        Family History   Problem Relation Age of Onset   ??? Stroke Mother    ??? Alzheimer Mother    ??? Cancer Father    ??? Diabetes Brother    ??? Dementia Brother      Social History     Tobacco Use   ??? Smoking status: Current Every Day Smoker   ??? Smokeless tobacco: Never Used   Substance Use Topics   ??? Alcohol use: Yes     Alcohol/week: 1.2 oz     Types: 2 Cans of beer per week      Prior to Admission medications    Medication Sig Start Date End Date Taking? Authorizing Provider   meloxicam (MOBIC) 15 mg tablet Take 15 mg by mouth daily.   Yes Provider, Historical   gabapentin (NEURONTIN) 300 mg capsule Take 300 mg by mouth three (3) times daily.  Yes Provider, Historical   losartan (COZAAR) 50 mg tablet Take 1 Tab by mouth daily. 04/15/17  Yes Albin Felling, MD          Review of Systems:      Risks colonoscopy described- colon injury, missed lesion, anesthesia problems, bleeding       Denorris Reust B. Savayah Waltrip, LPN  May 27, 6961  11:19 AM

## 2017-06-03 ENCOUNTER — Encounter: Attending: Family Medicine | Primary: Family Medicine

## 2017-07-01 ENCOUNTER — Ambulatory Visit
Admit: 2017-07-01 | Discharge: 2017-07-01 | Payer: PRIVATE HEALTH INSURANCE | Attending: Family Medicine | Primary: Family Medicine

## 2017-07-01 DIAGNOSIS — I10 Essential (primary) hypertension: Secondary | ICD-10-CM

## 2017-07-01 NOTE — ACP (Advance Care Planning) (Signed)
Advance Directive:  1. Do you have an advance directive in place? Patient Reply:no    2. If not, would you like material regarding how to put one in place? Patient Reply: no

## 2017-07-01 NOTE — Progress Notes (Signed)
Kristen Ramsey presents today for   Chief Complaint   Patient presents with   ??? Hypertension     follow up   ??? Knee Pain     Bilateral   ??? Toe Pain     Patient stated that she been having right toe pain x 2 week. Patient stated that her pain is 5/10.       [place] preferred language for health care discussion is english/other.    Is someone accompanying this pt? no    Is the patient using any DME equipment during OV? no    Depression Screening:  PHQ over the last two weeks 07/01/2017   PHQ Not Done Active Diagnosis of Depression or Bipolar Disorder   Little interest or pleasure in doing things Not at all   Feeling down, depressed, irritable, or hopeless Not at all   Total Score PHQ 2 0   Trouble falling or staying asleep, or sleeping too much -   Feeling tired or having little energy -   Poor appetite, weight loss, or overeating -   Feeling bad about yourself - or that you are a failure or have let yourself or your family down -   Trouble concentrating on things such as school, work, reading, or watching TV -   Moving or speaking so slowly that other people could have noticed; or the opposite being so fidgety that others notice -   Thoughts of being better off dead, or hurting yourself in some way -   PHQ 9 Score -   How difficult have these problems made it for you to do your work, take care of your home and get along with others -       Learning Assessment:  Learning Assessment 04/15/2017   PRIMARY LEARNER Patient   HIGHEST LEVEL OF EDUCATION - PRIMARY LEARNER  SOME COLLEGE   BARRIERS PRIMARY LEARNER NONE   CO-LEARNER CAREGIVER No   PRIMARY LANGUAGE ENGLISH   LEARNER PREFERENCE PRIMARY DEMONSTRATION   ANSWERED BY patient   RELATIONSHIP SELF       Abuse Screening:  Abuse Screening Questionnaire 04/15/2017   Do you ever feel afraid of your partner? N   Are you in a relationship with someone who physically or mentally threatens you? N   Is it safe for you to go home? Y           Coordination of Care:   1. Have you been to the ER, urgent care clinic since your last visit?   Hospitalized since your last visit? no    2. Have you seen or consulted any other health care providers outside of the [facility] since your last visit?Include any pap smears or colon screening. no    Per-Dr. Berenice Primas ok medication not taking to be deleted.    Advance Directive:  1. Do you have an advance directive in place? Patient Reply:no    2. If not, would you like material regarding how to put one in place? Patient Reply: no

## 2017-07-01 NOTE — Patient Instructions (Signed)
High Blood Pressure: Care Instructions  Your Care Instructions    If your blood pressure is usually above 130/80, you have high blood pressure, or hypertension. That means the top number is 130 or higher or the bottom number is 80 or higher, or both.  Despite what a lot of people think, high blood pressure usually doesn't cause headaches or make you feel dizzy or lightheaded. It usually has no symptoms. But it does increase your risk for heart attack, stroke, and kidney or eye damage. The higher your blood pressure, the more your risk increases.  Your doctor will give you a goal for your blood pressure. Your goal will be based on your health and your age.  Lifestyle changes, such as eating healthy and being active, are always important to help lower blood pressure. You might also take medicine to reach your blood pressure goal.  Follow-up care is a key part of your treatment and safety. Be sure to make and go to all appointments, and call your doctor if you are having problems. It's also a good idea to know your test results and keep a list of the medicines you take.  How can you care for yourself at home?  Medical treatment  ?? If you stop taking your medicine, your blood pressure will go back up. You may take one or more types of medicine to lower your blood pressure. Be safe with medicines. Take your medicine exactly as prescribed. Call your doctor if you think you are having a problem with your medicine.  ?? Talk to your doctor before you start taking aspirin every day. Aspirin can help certain people lower their risk of a heart attack or stroke. But taking aspirin isn't right for everyone, because it can cause serious bleeding.  ?? See your doctor regularly. You may need to see the doctor more often at first or until your blood pressure comes down.  ?? If you are taking blood pressure medicine, talk to your doctor before you take decongestants or anti-inflammatory medicine, such as ibuprofen.  Some of these medicines can raise blood pressure.  ?? Learn how to check your blood pressure at home.  Lifestyle changes  ?? Stay at a healthy weight. This is especially important if you put on weight around the waist. Losing even 10 pounds can help you lower your blood pressure.  ?? If your doctor recommends it, get more exercise. Walking is a good choice. Bit by bit, increase the amount you walk every day. Try for at least 30 minutes on most days of the week. You also may want to swim, bike, or do other activities.  ?? Avoid or limit alcohol. Talk to your doctor about whether you can drink any alcohol.  ?? Try to limit how much sodium you eat to less than 2,300 milligrams (mg) a day. Your doctor may ask you to try to eat less than 1,500 mg a day.  ?? Eat plenty of fruits (such as bananas and oranges), vegetables, legumes, whole grains, and low-fat dairy products.  ?? Lower the amount of saturated fat in your diet. Saturated fat is found in animal products such as milk, cheese, and meat. Limiting these foods may help you lose weight and also lower your risk for heart disease.  ?? Do not smoke. Smoking increases your risk for heart attack and stroke. If you need help quitting, talk to your doctor about stop-smoking programs and medicines. These can increase your chances of quitting for good.  When   should you call for help?  Call 911 anytime you think you may need emergency care. This may mean having symptoms that suggest that your blood pressure is causing a serious heart or blood vessel problem. Your blood pressure may be over 180/120.  ??For example, call 911 if:  ?? ?? You have symptoms of a heart attack. These may include:  ? Chest pain or pressure, or a strange feeling in the chest.  ? Sweating.  ? Shortness of breath.  ? Nausea or vomiting.  ? Pain, pressure, or a strange feeling in the back, neck, jaw, or upper belly or in one or both shoulders or arms.  ? Lightheadedness or sudden weakness.   ? A fast or irregular heartbeat.   ?? ?? You have symptoms of a stroke. These may include:  ? Sudden numbness, tingling, weakness, or loss of movement in your face, arm, or leg, especially on only one side of your body.  ? Sudden vision changes.  ? Sudden trouble speaking.  ? Sudden confusion or trouble understanding simple statements.  ? Sudden problems with walking or balance.  ? A sudden, severe headache that is different from past headaches.   ?? ?? You have severe back or belly pain.   ??Do not wait until your blood pressure comes down on its own. Get help right away.  ??Call your doctor now or seek immediate care if:  ?? ?? Your blood pressure is much higher than normal (such as 180/120 or higher), but you don't have symptoms.   ?? ?? You think high blood pressure is causing symptoms, such as:  ? Severe headache.  ? Blurry vision.   ??Watch closely for changes in your health, and be sure to contact your doctor if:  ?? ?? Your blood pressure measures higher than your doctor recommends at least 2 times. That means the top number is higher or the bottom number is higher, or both.   ?? ?? You think you may be having side effects from your blood pressure medicine.   Where can you learn more?  Go to http://www.healthwise.net/[employer].  Enter X567 in the search box to learn more about "High Blood Pressure: Care Instructions."  Current as of: June 26, 2016  Content Version: 11.8  ?? 2006-2018 [employer]. Care instructions adapted under license by [employer] (which disclaims liability or warranty for this information). If you have questions about a medical condition or this instruction, always ask your healthcare professional. Healthwise, Incorporated disclaims any warranty or liability for your use of this information.

## 2017-07-01 NOTE — Progress Notes (Signed)
Chief Complaint   Patient presents with   ??? Hypertension     follow up   ??? Knee Pain     Bilateral   ??? Toe Pain     Patient stated that she been having right toe pain x 2 week. Patient stated that her pain is 5/10.     HISTORY OF PRESENT ILLNESS  Kristen Ramsey is a 61 y.o. female.  HPI  Patient is here for a 3 mo follow up of htn, knee pain, ifg.    Pt did restart the meloxicam and noticed improvement in her knee pain.  She did start the gabapentin every other night.  She feels that this does help.  She does not awaken with pain during the night.     Patient had labs on 04/17/17.  Labs reviewed in detail with patient     Patient does not need medication refills today.      New concerns today: pt c/o R toe pain x 2 wks.  The pain is intermittent.      Review of Systems   Constitutional: Negative.    HENT: Negative.    Respiratory: Negative.    Cardiovascular: Negative.    Musculoskeletal: Positive for joint pain.   All other systems reviewed and are negative.      Physical Exam  Physical Exam   Nursing note and vitals reviewed.  Constitutional: She is oriented to person, place, and time. She appears well-developed and well-nourished.   HENT:   Head: Normocephalic and atraumatic.   Right Ear: External ear normal.   Left Ear: External ear normal.   Nose: Nose normal.   Eyes: Conjunctivae and EOM are normal.   Neck: Normal range of motion. Neck supple. No JVD present. Carotid bruit is not present. No thyromegaly present.   Cardiovascular: Normal rate, regular rhythm, normal heart sounds and intact distal pulses.  Exam reveals no gallop and no friction rub.    No murmur heard.  Pulmonary/Chest: Effort normal and breath sounds normal. She has no wheezes. She has no rhonchi. She has no rales.   Abdominal: Soft. Bowel sounds are normal.   Musculoskeletal: Normal range of motion.   R great toe: Thickened dystrophic nail with ingrowing portion  Neurological: She is alert and oriented to person, place, and time.  Coordination normal.   Skin: Skin is warm and dry.   Psychiatric: She has a normal mood and affect. Her behavior is normal. Judgment and thought content normal.     ASSESSMENT and PLAN  Diagnoses and all orders for this visit:    1. Essential hypertension  Slightly elevated today.  Recheck at follow-up  2. Impaired fasting glucose  Stable.  Continue present treatment plan  3. Osteoarthritis, unspecified osteoarthritis type, unspecified site  Stable.  Continue present treatment plan  4. Ingrowing toenail  -     REFERRAL TO PODIATRY    5. Onychomycosis of great toe  -     REFERRAL TO PODIATRY      Follow-up Disposition:  Return in about 3 months (around 09/29/2017) for high blood pressure, elevated fasting blood sugar.

## 2017-07-08 NOTE — Progress Notes (Signed)
Chart Audit for Suspect Conditions completed.

## 2017-09-03 ENCOUNTER — Inpatient Hospital Stay: Payer: MEDICARE

## 2017-09-03 MED ORDER — PROPOFOL 10 MG/ML IV EMUL
10 mg/mL | INTRAVENOUS | Status: DC | PRN
Start: 2017-09-03 — End: 2017-09-03
  Administered 2017-09-03 (×6): via INTRAVENOUS

## 2017-09-03 MED ORDER — LACTATED RINGERS IV
INTRAVENOUS | Status: DC
Start: 2017-09-03 — End: 2017-09-03
  Administered 2017-09-03: 15:00:00 via INTRAVENOUS

## 2017-09-03 MED ORDER — INSULIN LISPRO 100 UNIT/ML INJECTION
100 unit/mL | Freq: Once | SUBCUTANEOUS | Status: DC
Start: 2017-09-03 — End: 2017-09-03

## 2017-09-03 MED ORDER — SODIUM CHLORIDE 0.9 % IJ SYRG
Freq: Three times a day (TID) | INTRAMUSCULAR | Status: DC
Start: 2017-09-03 — End: 2017-09-03

## 2017-09-03 MED ORDER — LIDOCAINE (PF) 20 MG/ML (2 %) IJ SOLN
20 mg/mL (2 %) | INTRAMUSCULAR | Status: DC | PRN
Start: 2017-09-03 — End: 2017-09-03
  Administered 2017-09-03: 16:00:00 via INTRAVENOUS

## 2017-09-03 MED ORDER — SODIUM CHLORIDE 0.9 % IJ SYRG
INTRAMUSCULAR | Status: DC | PRN
Start: 2017-09-03 — End: 2017-09-03

## 2017-09-03 MED ORDER — SODIUM CHLORIDE 0.9 % INJECTION
202 mg/2 mL | Freq: Once | INTRAMUSCULAR | Status: AC
Start: 2017-09-03 — End: 2017-09-03
  Administered 2017-09-03: 15:00:00 via INTRAVENOUS

## 2017-09-03 MED FILL — BD POSIFLUSH NORMAL SALINE 0.9 % INJECTION SYRINGE: INTRAMUSCULAR | Qty: 40

## 2017-09-03 MED FILL — FAMOTIDINE (PF) 20 MG/2 ML IV: 20 mg/2 mL | INTRAVENOUS | Qty: 2

## 2017-09-03 MED FILL — LACTATED RINGERS IV: INTRAVENOUS | Qty: 1000

## 2017-09-03 NOTE — Procedures (Signed)
[facility]  [address], [place] [address]  [phone]                    Colonoscopy Procedure Note      Kristen Ramsey  September 03, 1955  [id number]              Date of Procedure: 09/03/2017    Preoperative diagnosis: Z12.11,  Colon cancer Screening    Postoperative diagnosis: Colon Polyps of cecum and rectosigmoid colon    Operator:  Jannet Mantis, MD    Assistant(s): Endoscopy Technician-1: Phillips Hay  Endoscopy RN-1: Antionette Fairy, RN  Endoscopy RN-2: Emmit Alexanders, RN    Sedation: MAC    Procedure Details:  Prior to the procedure, a history and physical were performed. The patient???s medications, allergies and sensitivities were reviewed and all questions were answered.  After informed consent was obtained for the procedure, with all risks and benefits of procedure explained. The patient was taken to the endoscopy suite and placed in the left lateral decubitus position.  Patient identification and proposed procedure were verified prior to the procedure by the nurse and I. After sequential anesthesia administered by anesthesiologist, a digital rectal exam was performed and was normal.  The Olympus video colonoscope was introduced through the anus and advanced to cecum, which was identified by the ileocecal valve and appendiceal orifice.  The quality of preparation was good.  The colonoscope was slowly withdrawn and the mucosa examined for any abnormalities.  Cecal withdrawal time was greater than 6 minutes. The patient tolerated the procedure well. There were no complications.      Findings/Interventions:   Polyps - #1, 5 mm in size, located in the cecum, removed by cold biopsy and sent for pathology, - #2, 5 mm in size, located in the rectosigmoid, removed by cold biopsy and sent for pathology    EBL: none    Recommendations: -Repeat colonoscopy in 5 years.   NO aspirin for 5 days     Discharge Disposition:  Home  Jannet Mantis, MD  09/03/2017  11:35 AM

## 2017-09-03 NOTE — Anesthesia Pre-Procedure Evaluation (Addendum)
Anesthetic History   No history of anesthetic complications            Review of Systems / Medical History  Patient summary reviewed and pertinent labs reviewed    Pulmonary    COPD      Smoker         Neuro/Psych     seizures: well controlled    Psychiatric history     Cardiovascular    Hypertension          Past MI and CAD    Exercise tolerance: <4 METS     GI/Hepatic/Renal  Within defined limits              Endo/Other    Diabetes: well controlled, type 2    Arthritis     Other Findings            Physical Exam    Airway  Mallampati: I  TM Distance: > 6 cm  Neck ROM: normal range of motion   Mouth opening: Normal     Cardiovascular  Regular rate and rhythm,  S1 and S2 normal,  no murmur, click, rub, or gallop             Dental  No notable dental hx       Pulmonary  Breath sounds clear to auscultation               Abdominal  GI exam deferred       Other Findings            Anesthetic Plan    ASA: 3  Anesthesia type: MAC          Induction: Intravenous  Anesthetic plan and risks discussed with: Patient

## 2017-09-03 NOTE — Progress Notes (Signed)
Benign polyp(s). Repeat colonoscopy in 5 years as planned.

## 2017-09-03 NOTE — Anesthesia Post-Procedure Evaluation (Signed)
Procedure(s):  COLONOSCOPY / polypectomy.    Anesthesia Post Evaluation      Multimodal analgesia: multimodal analgesia used between 6 hours prior to anesthesia start to PACU discharge  Patient location during evaluation: bedside  Patient participation: complete - patient participated  Level of consciousness: awake  Pain score: 1  Pain management: adequate  Airway patency: patent  Anesthetic complications: no  Cardiovascular status: stable  Respiratory status: acceptable  Hydration status: acceptable  Post anesthesia nausea and vomiting:  controlled      Visit Vitals  BP 130/89   Pulse 69   Temp 37 ??C (98.6 ??F)   Resp 21   Ht 5\' 6"  (1.676 m)   Wt 78.6 kg (173 lb 4 oz)   SpO2 100%   BMI 27.96 kg/m??

## 2017-09-03 NOTE — H&P (Signed)
HPI: Kristen Ramsey is a 62 y.o. female presenting with chief complain of need for crc screening.    Past Medical History:   Diagnosis Date   ??? Arthritis    ??? Chronic obstructive pulmonary disease ([place])    ??? Depression    ??? Elevated glucose    ??? Glaucoma    ??? Gout    ??? HTN (hypertension)    ??? Joint pain        Past Surgical History:   Procedure Laterality Date   ??? HX ORTHOPAEDIC      Left hand repair due to work injury   ??? HX TUBAL LIGATION         Family History   Problem Relation Age of Onset   ??? Stroke Mother    ??? Alzheimer Mother    ??? Cancer Father    ??? Diabetes Brother    ??? Dementia Brother        Social History     Socioeconomic History   ??? Marital status: LEGALLY SEPARATED     Spouse name: Not on file   ??? Number of children: Not on file   ??? Years of education: Not on file   ??? Highest education level: Not on file   Tobacco Use   ??? Smoking status: Current Every Day Smoker   ??? Smokeless tobacco: Never Used   Substance and Sexual Activity   ??? Alcohol use: Yes     Alcohol/week: 1.2 oz     Types: 2 Cans of beer per week   ??? Drug use: No   ??? Sexual activity: Yes       Review of Systems - neg    Outpatient Medications Marked as Taking for the 09/03/17 encounter [facility] Encounter)   Medication Sig Dispense Refill   ??? ALBUTEROL SULFATE IN Take 1 Puff by inhalation.     ??? meloxicam (MOBIC) 15 mg tablet Take 15 mg by mouth daily.     ??? losartan (COZAAR) 50 mg tablet Take 1 Tab by mouth daily. 90 Tab 3       Allergies   Allergen Reactions   ??? Codeine Seizures     Other reaction(s): Other (See Comments)  seizures   ??? Tomato Hives   ??? Lisinopril Cough       Vitals:    09/03/17 1015   BP: 139/77   Pulse: 70   Resp: 16   Temp: 98.4 ??F (36.9 ??C)   SpO2: 99%   Weight: 78.6 kg (173 lb 4 oz)   Height: 5\' 6"  (1.676 m)       Physical Exam   Constitutional: She appears well-developed and well-nourished.   HENT:   Head: Normocephalic and atraumatic.   Eyes: Conjunctivae and EOM are normal.    Abdominal: Soft. She exhibits no distension. There is no tenderness.   Musculoskeletal: Normal range of motion.   Lymphadenopathy:     She has no cervical adenopathy.        Right: No inguinal adenopathy present.        Left: No inguinal adenopathy present.   Neurological: She exhibits normal muscle tone.   Skin: Skin is warm and dry. No rash noted.   Psychiatric: She has a normal mood and affect. Her speech is normal.       Assessment / Plan    colonoscopy    The diagnoses and plan were discussed with the patient. All questions answered.  Plan of care agreed to by all  concerned.

## 2017-09-09 MED FILL — PROPOFOL 10 MG/ML IV EMUL: 10 mg/mL | INTRAVENOUS | Qty: 350

## 2017-09-09 MED FILL — XYLOCAINE-MPF 20 MG/ML (2 %) INJECTION SOLUTION: 20 mg/mL (2 %) | INTRAMUSCULAR | Qty: 40

## 2017-09-17 NOTE — Telephone Encounter (Addendum)
-----   Message from Jannet Mantis, MD sent at 09/08/2017  8:50 AM EST -----  Benign polyp(s). Repeat colonoscopy in 5 years as planned.    Notified patient of pathology results. [facility] in [place] for 5 years. Patient understands

## 2017-09-30 ENCOUNTER — Ambulatory Visit
Admit: 2017-09-30 | Discharge: 2017-09-30 | Payer: PRIVATE HEALTH INSURANCE | Attending: Family Medicine | Primary: Family Medicine

## 2017-09-30 DIAGNOSIS — I10 Essential (primary) hypertension: Secondary | ICD-10-CM

## 2017-09-30 NOTE — Progress Notes (Signed)
Chief Complaint   Patient presents with   ??? Hypertension   ??? Blood sugar problem     elevated fasting blood glucose         HPI    Kristen Ramsey is a 62 y.o. female presenting today for    3 months  follow up of htn, ifg.    New concerns today: pt was seen at [facility] ER in Jan 2019 for eczema.   She c/o ongoing itching and burning of her hands along with rash.  She was given betamethasone 0.05% cream; she did use it regularly.  She has seen a dermatologist for this in [place] in the past and recalls that she was on a pill along with a cream.        Pt reports that she has been sexually active and would like to have sti screening.  She did not engage in any high risk activities but would like screening anyway.     Review of Systems   Constitutional: Negative.    HENT: Negative.    Respiratory: Negative.    Cardiovascular: Negative.    Skin: Positive for itching and rash.   All other systems reviewed and are negative.      Physical Exam  Physical Exam   Nursing note and vitals reviewed.  Constitutional: She is oriented to person, place, and time. She appears well-developed and well-nourished.   HENT:   Head: Normocephalic and atraumatic.   Right Ear: External ear normal.   Left Ear: External ear normal.   Nose: Nose normal.   Eyes: Conjunctivae and EOM are normal.   Neck: Normal range of motion. Neck supple. No JVD present. Carotid bruit is not present. No thyromegaly present.   Cardiovascular: Normal rate, regular rhythm, normal heart sounds and intact distal pulses.  Exam reveals no gallop and no friction rub.    No murmur heard.  Pulmonary/Chest: Effort normal and breath sounds normal. She has no wheezes. She has no rhonchi. She has no rales.   Abdominal: Soft. Bowel sounds are normal.   Musculoskeletal: Normal range of motion.   Neurological: She is alert and oriented to person, place, and time. Coordination normal.   Skin: Skin is warm and dry.  Mild erythema and papular eruption noted on bilateral hands.   Psychiatric: She has a normal mood and affect. Her behavior is normal. Judgment and thought content normal.     Diagnoses and all orders for this visit:    1. Essential hypertension  -     METABOLIC PANEL, COMPREHENSIVE; Future  -     LIPID PANEL; Future  Stable.  Continue present treatment plan    2. Routine screening for STI (sexually transmitted infection)  -     HIV 1/2 AG/AB, 4TH GENERATION,W RFLX CONFIRM; Future  -     RPR; Future  -     HEPATITIS PANEL, ACUTE; Future  -     CHLAMYDIA/NEISSERIA AMPLIFICATION; Future    3. Eczema, unspecified type  -     REFERRAL TO DERMATOLOGY    4. IFG (impaired fasting glucose)  -     METABOLIC PANEL, COMPREHENSIVE; Future  -     LIPID PANEL; Future  -     HEMOGLOBIN A1C WITH EAG; Future      Follow-up Disposition:  Return in about 3 months (around 12/31/2017) for elevated fasting blood sugar, high blood pressure.

## 2017-09-30 NOTE — Progress Notes (Signed)
Chief Complaint   Patient presents with   ??? Hypertension   ??? Blood sugar problem     elevated fasting blood glucose       Health Maintenance Due   Topic Date Due   ??? Hepatitis C Screening  1956/05/13   ??? Pneumococcal 19-64 Medium Risk (1 of 1 - PPSV23) 06/10/1975   ??? PAP AKA CERVICAL CYTOLOGY  06/09/1977   ??? Shingrix Vaccine Age 62> (1 of 2) 06/09/2006   ??? BREAST CANCER SCRN MAMMOGRAM  06/09/2006   ??? MEDICARE YEARLY EXAM  08/27/2017       Health Maintenance reviewed     1. Have you been to the ER, urgent care clinic since your last visit?  Hospitalized since your last visit?Yes When: 2/19 Where: [facility] Reason for visit: itching    2. Have you seen or consulted any other health care providers outside of the [place] since your last visit?  Include any pap smears or colon screening. No

## 2017-10-01 ENCOUNTER — Inpatient Hospital Stay: Admit: 2017-10-01 | Payer: MEDICARE | Primary: Family Medicine

## 2017-10-01 LAB — METABOLIC PANEL, COMPREHENSIVE
A-G Ratio: 1.2 (ref 0.8–1.7)
ALT (SGPT): 16 U/L (ref 13–56)
AST (SGOT): 14 U/L — ABNORMAL LOW (ref 15–37)
Albumin: 4 g/dL (ref 3.4–5.0)
Alk. phosphatase: 72 U/L (ref 45–117)
Anion gap: 8 mmol/L (ref 3.0–18)
BUN/Creatinine ratio: 13 (ref 12–20)
BUN: 11 MG/DL (ref 7.0–18)
Bilirubin, total: 0.3 MG/DL (ref 0.2–1.0)
CO2: 26 mmol/L (ref 21–32)
Calcium: 9.1 MG/DL (ref 8.5–10.1)
Chloride: 105 mmol/L (ref 100–108)
Creatinine: 0.82 MG/DL (ref 0.6–1.3)
GFR est AA: >60 mL/min/{1.73_m2} (ref >60)
GFR est non-AA: >60 mL/min/{1.73_m2} (ref >60)
Globulin: 3.3 g/dL (ref 2.0–4.0)
Glucose: 83 mg/dL (ref 74–99)
Potassium: 4.3 mmol/L (ref 3.5–5.5)
Protein, total: 7.3 g/dL (ref 6.4–8.2)
Sodium: 139 mmol/L (ref 136–145)

## 2017-10-01 LAB — HEPATITIS PANEL, ACUTE
Hep B surface Ag Interp.: NEGATIVE
Hep C virus Ab Interp.: NEGATIVE
Hepatitis A, IgM: NEGATIVE
Hepatitis B core, IgM: NEGATIVE
Hepatitis B surface Ag: 0.57 Index (ref <1.00)
Hepatitis C virus Ab: 0.04 Index (ref <0.80)

## 2017-10-01 LAB — HIV 1/2 AG/AB, 4TH GENERATION,W RFLX CONFIRM: HIV 1/2 Interpretation: NONREACTIVE

## 2017-10-01 LAB — LIPID PANEL
CHOL/HDL Ratio: 3.9 (ref 0–5.0)
Cholesterol, total: 217 MG/DL — ABNORMAL HIGH (ref <200)
HDL Cholesterol: 56 MG/DL (ref 40–60)
LDL, calculated: 128.4 MG/DL — ABNORMAL HIGH (ref 0–100)
Triglyceride: 163 MG/DL — ABNORMAL HIGH (ref <150)
VLDL, calculated: 32.6 MG/DL

## 2017-10-01 LAB — HEMOGLOBIN A1C WITH EAG
Est. average glucose: 134 mg/dL
Hemoglobin A1c: 6.3 % — ABNORMAL HIGH (ref 4.2–5.6)

## 2017-10-02 LAB — T PALLIDUM AB: T. pallidum interpretation.: REACTIVE — AB

## 2017-10-02 LAB — CHLAMYDIA/NEISSERIA AMPLIFICATION
Chlamydia amplification: NEGATIVE
N. gonorrhoeae amplification: NEGATIVE

## 2017-10-04 LAB — T PALLIDUM AB, BY FTA-ABS: T. pallidum Ab (FTA-Ab): REACTIVE — AB

## 2017-10-04 LAB — RPR: RPR: NONREACTIVE

## 2017-11-12 IMAGING — CR DG KNEE COMPLETE 4+V*R*
4 series · 4 of 4 positions shown · non-contrast
Comparison: February 05, 2010

CLINICAL DATA: Chronic pain

EXAM:
RIGHT KNEE - COMPLETE 4+ VIEW

[t knee ap right]
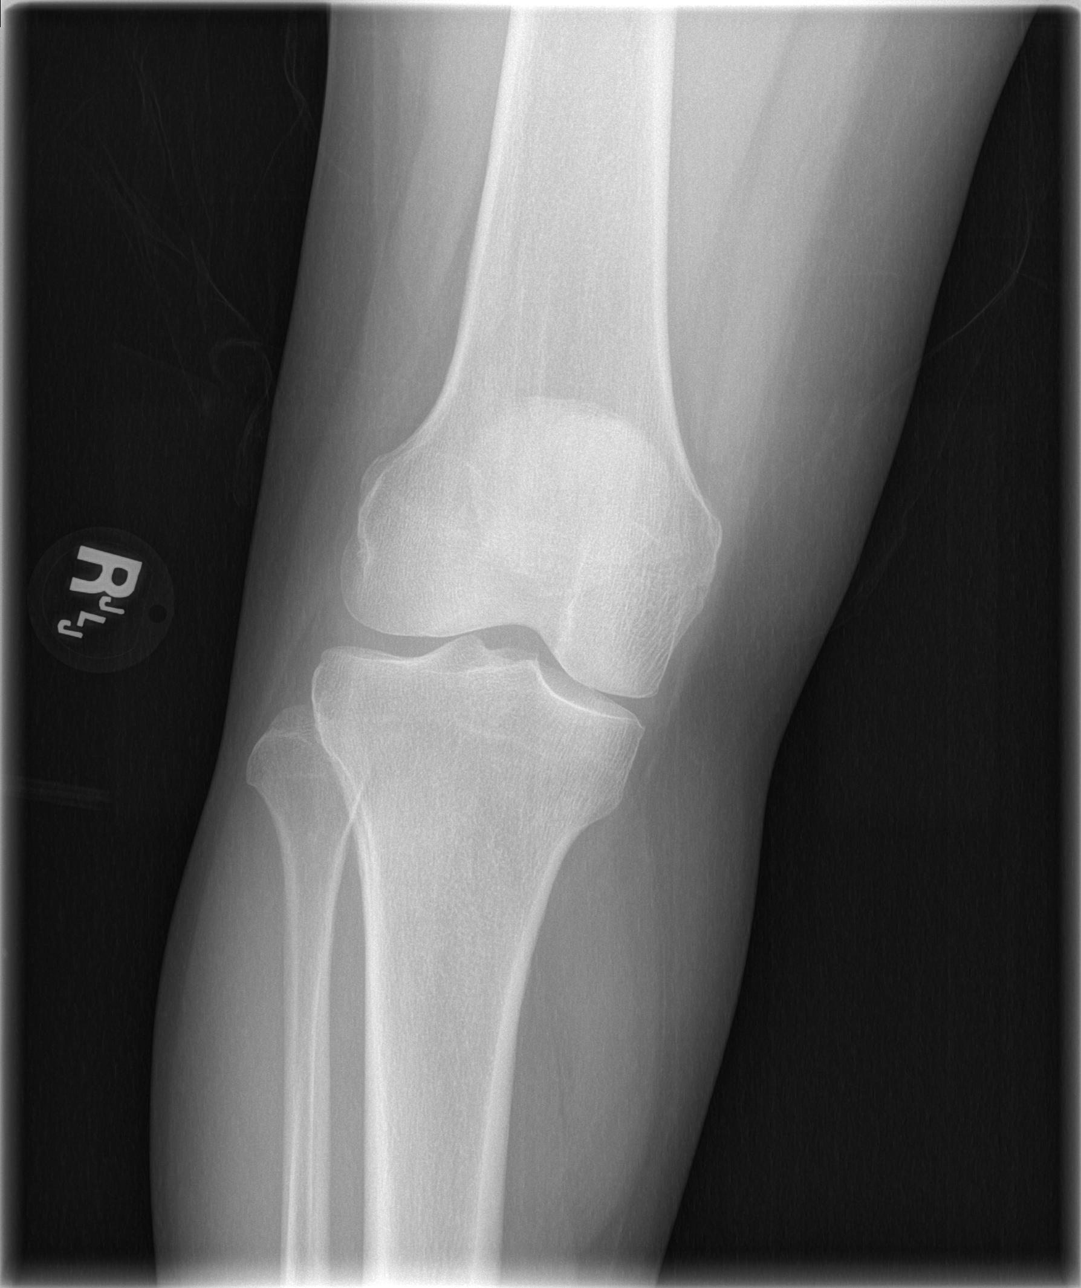

[t knee oblique right (1 of 2)]
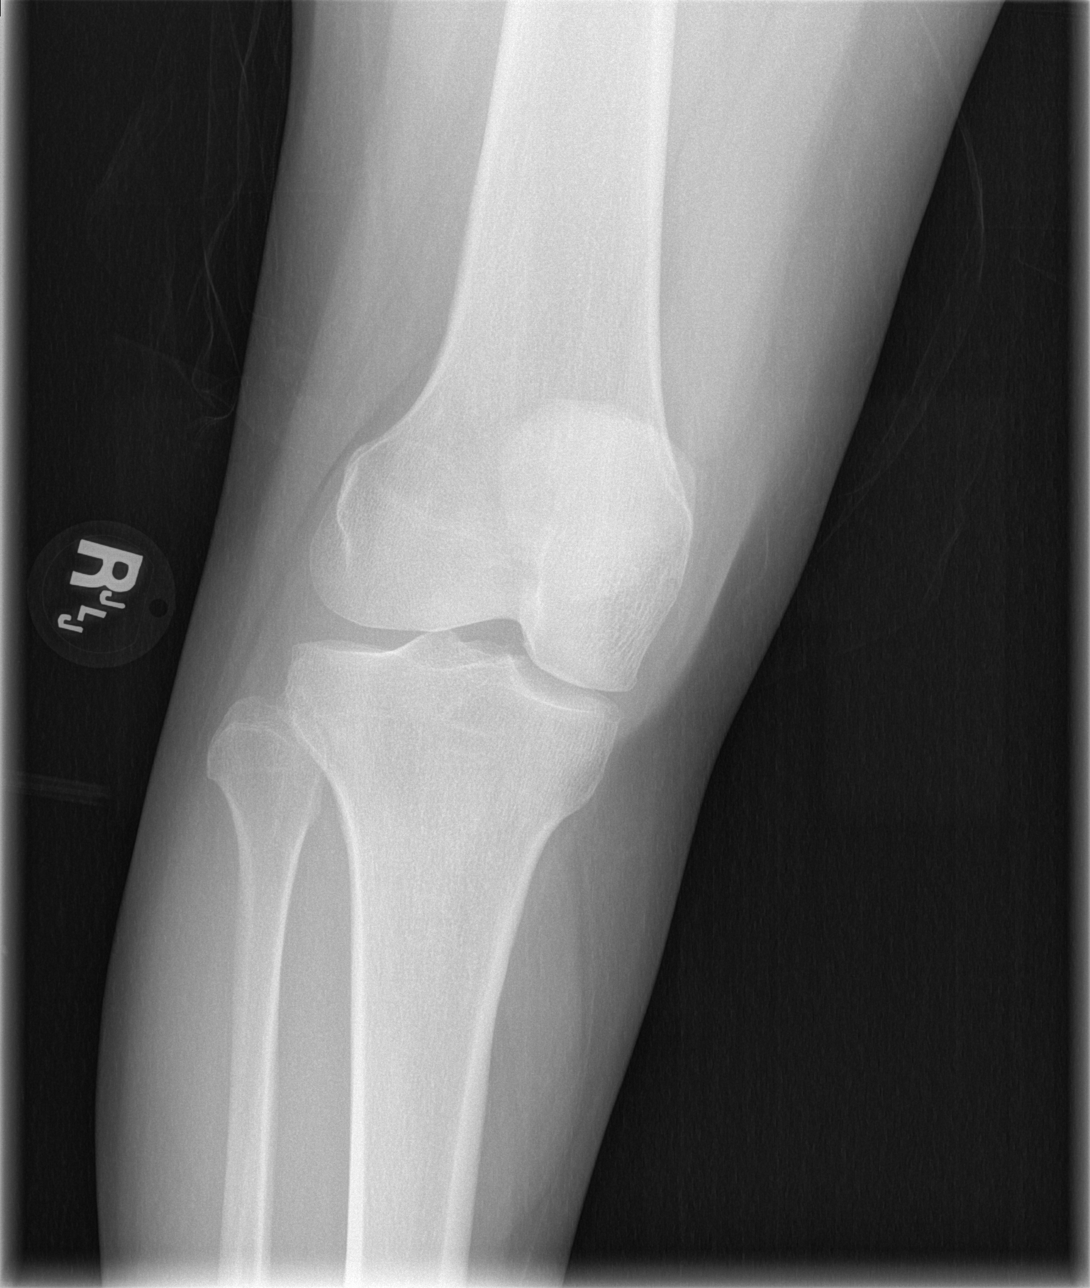

[t knee oblique right (2 of 2)]
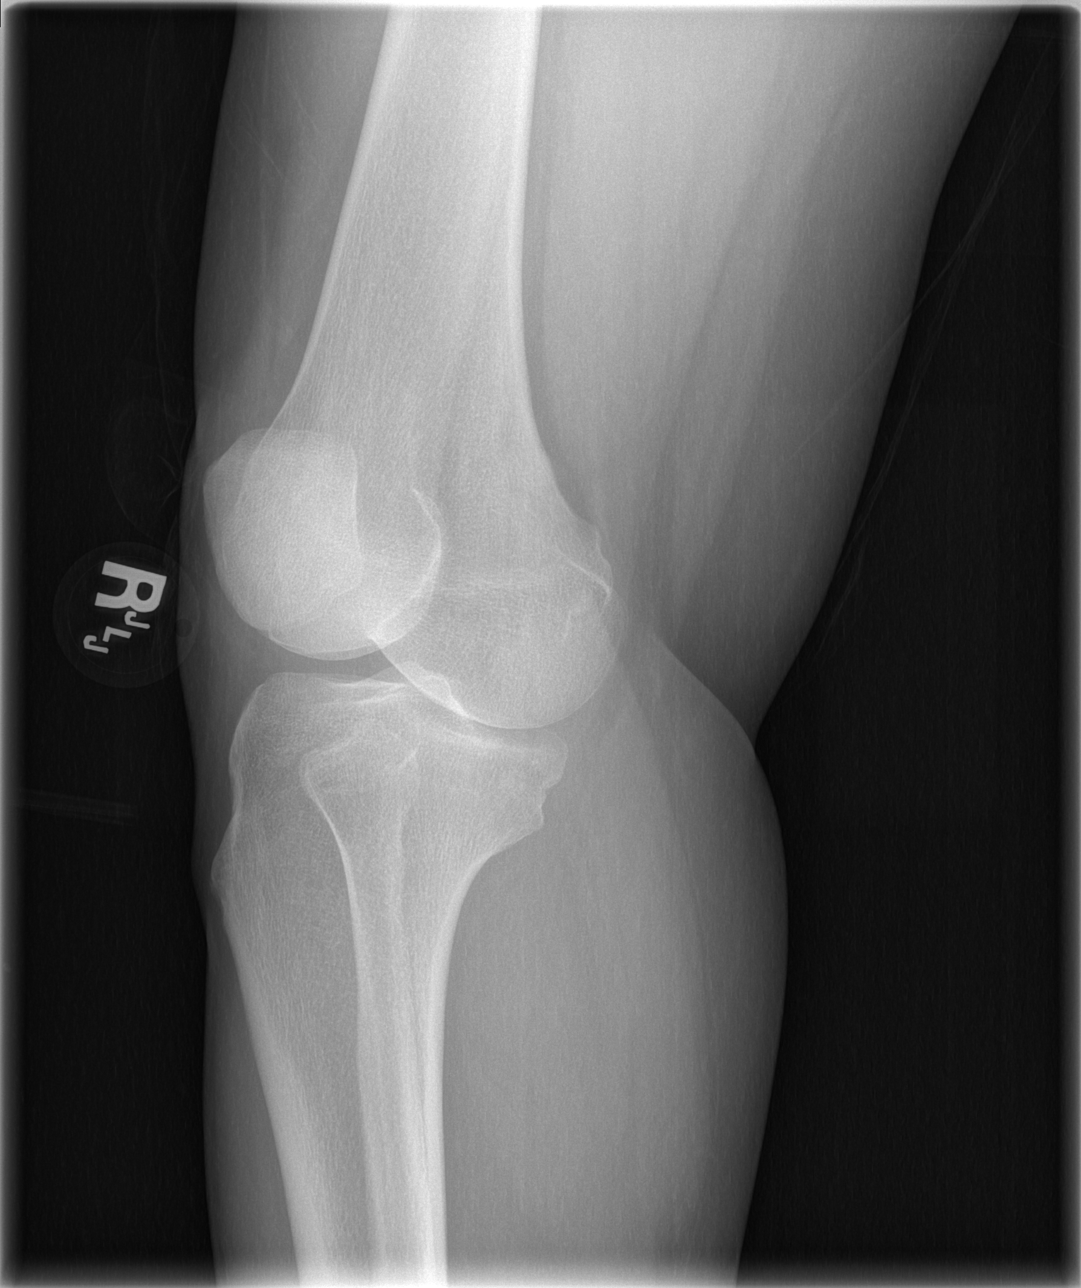

[t knee lat right]
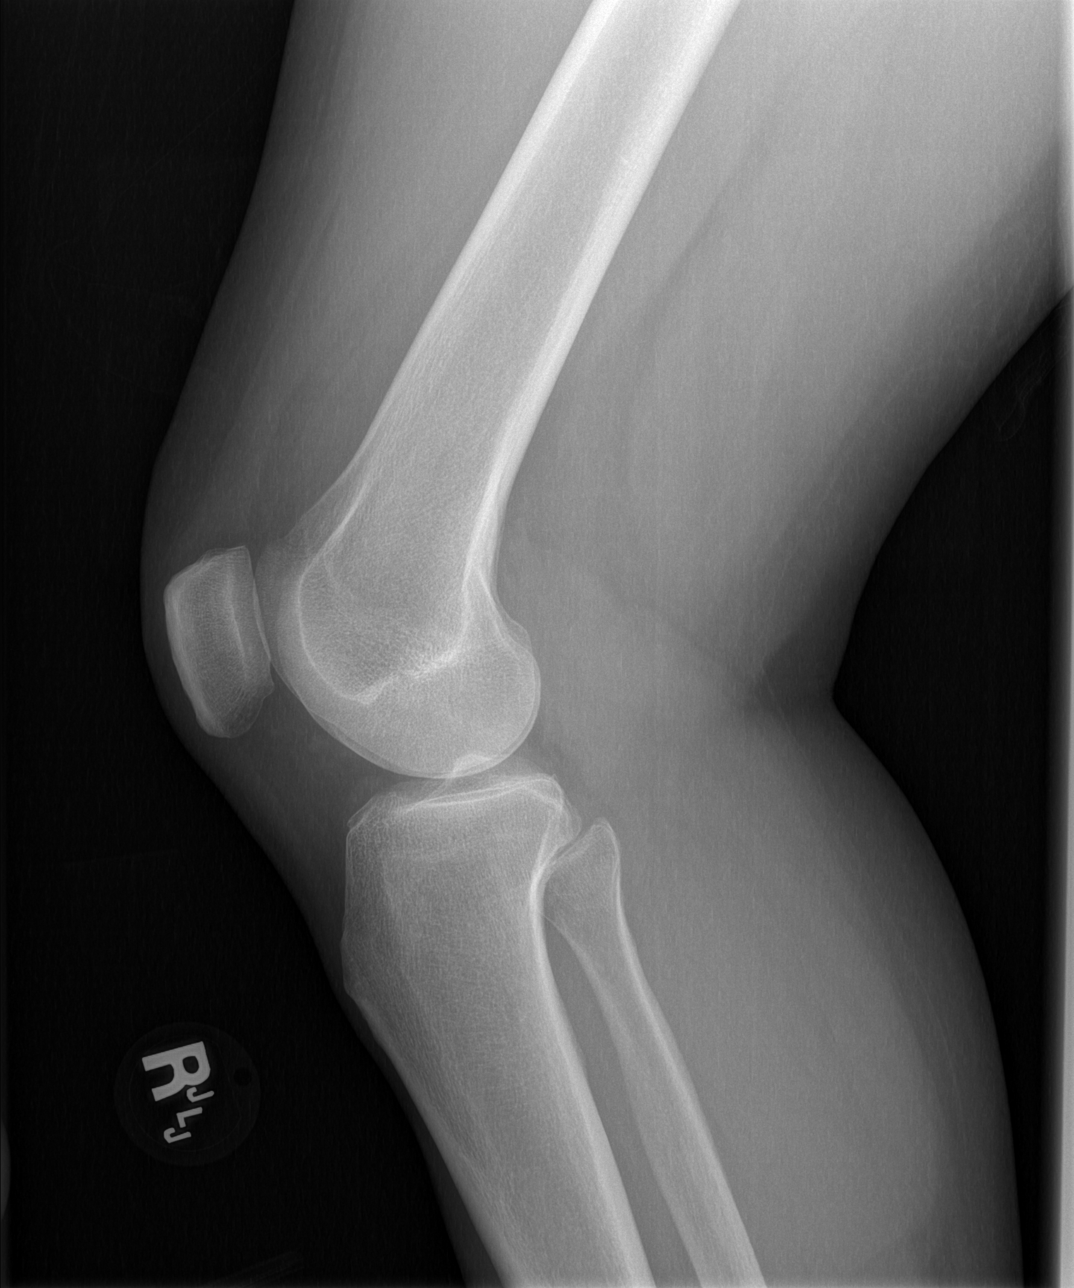

[4 of 4 positions shown; findings below may reference images not displayed]

FINDINGS: Frontal, lateral, and bilateral oblique views were obtained. There
is no fracture or dislocation. No joint effusion. Joint spaces
appear normal. No erosive change.
IMPRESSION: No fracture or joint effusion.  No appreciable arthropathy.

## 2017-11-12 IMAGING — CR DG KNEE COMPLETE 4+V*L*
4 series · 4 of 4 positions shown · non-contrast
Comparison: None.

CLINICAL DATA: Bilateral hip and knee pain

EXAM:
LEFT KNEE - COMPLETE 4+ VIEW

[t knee ap left]
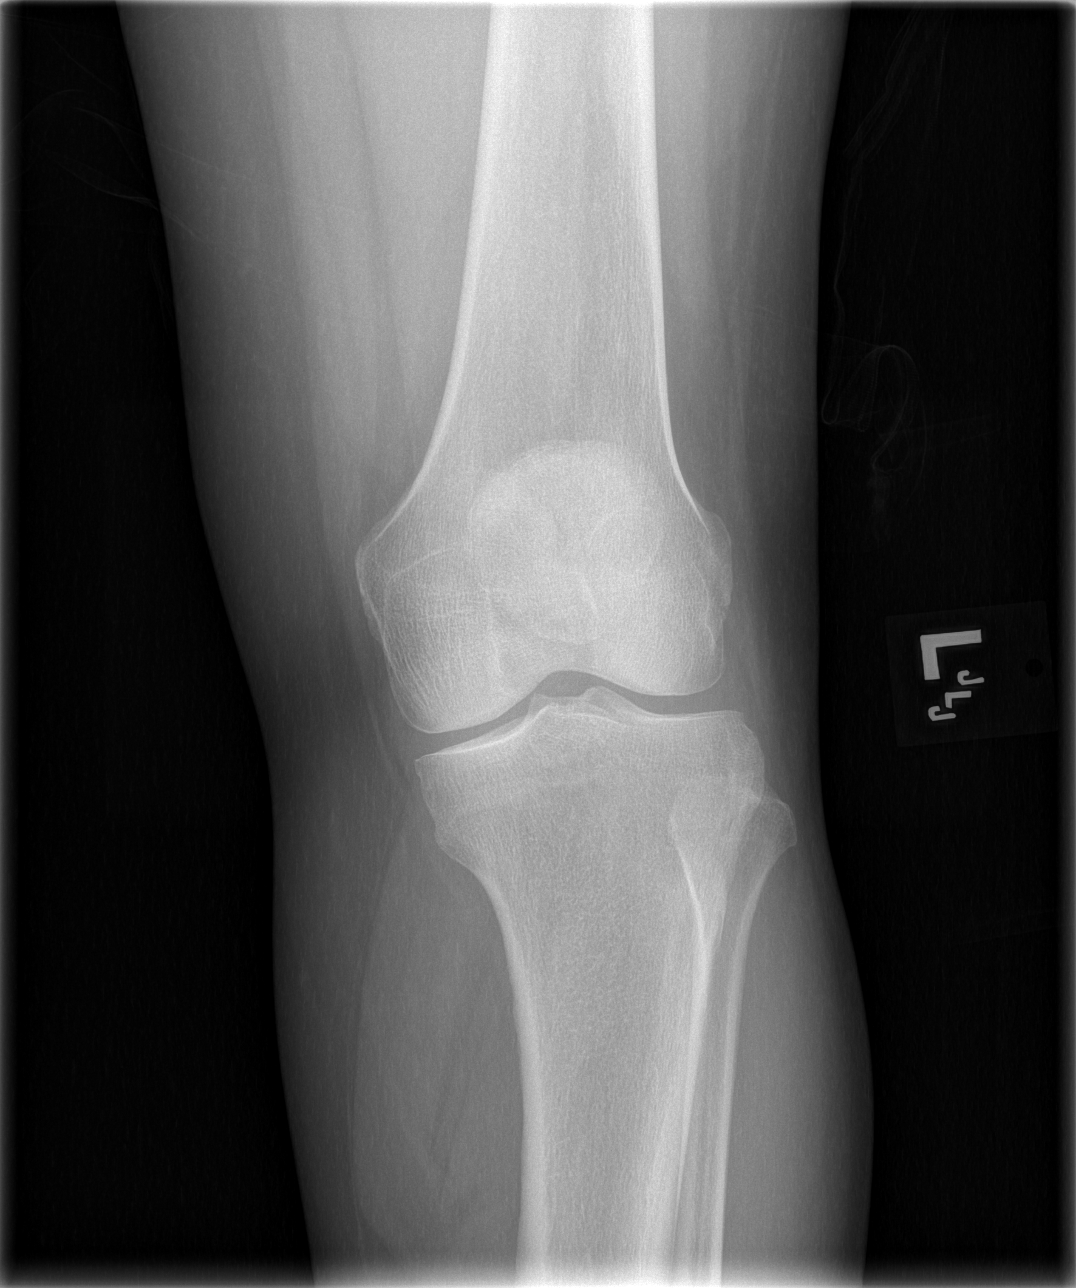

[t knee oblique left (1 of 2)]
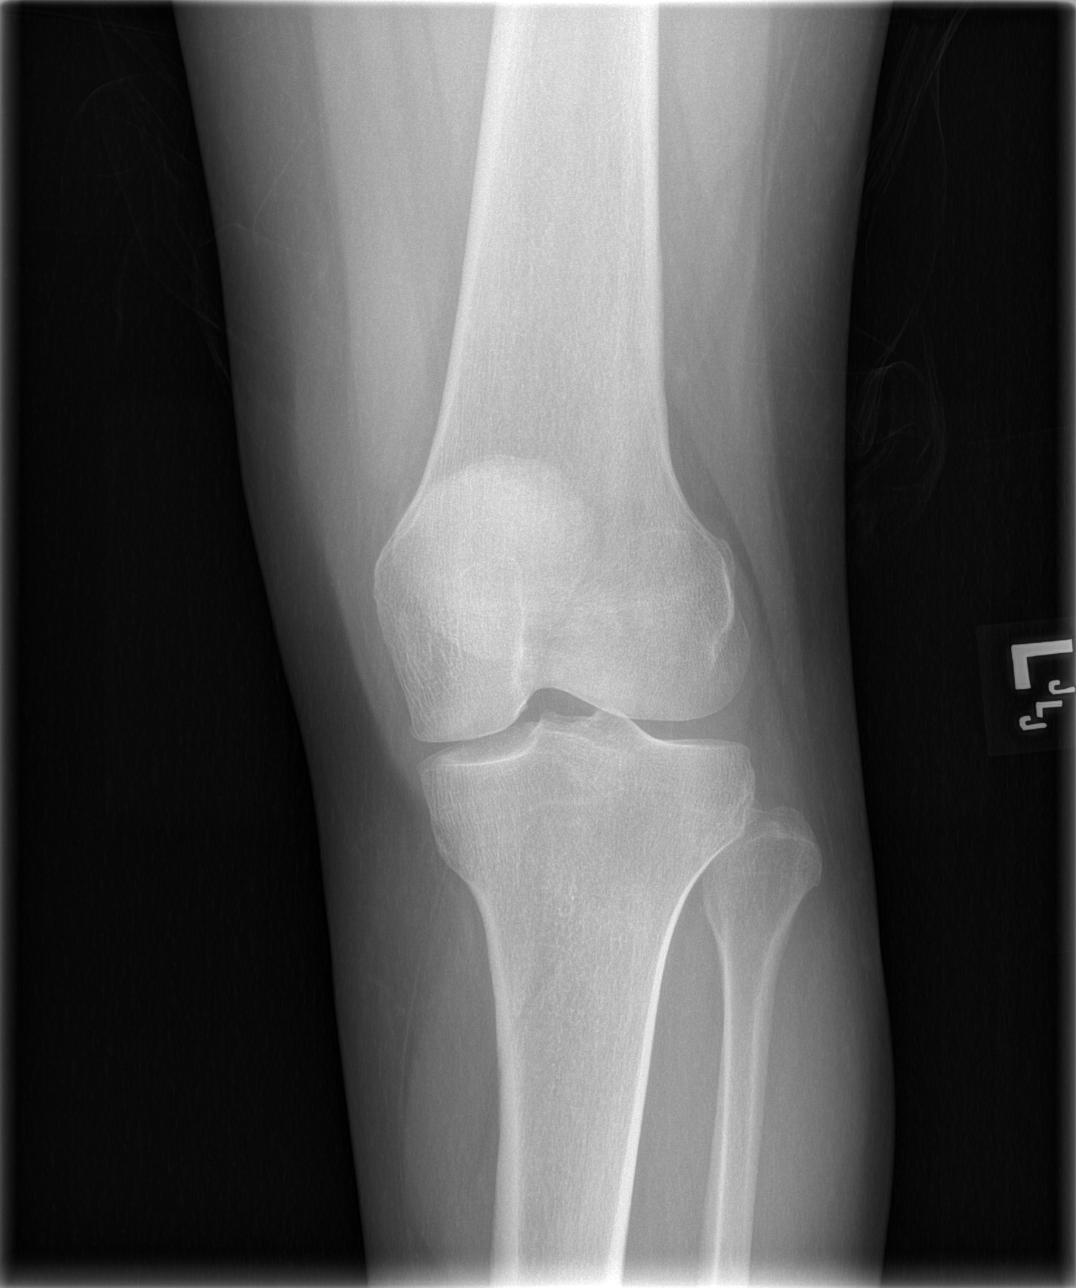

[t knee oblique left (2 of 2)]
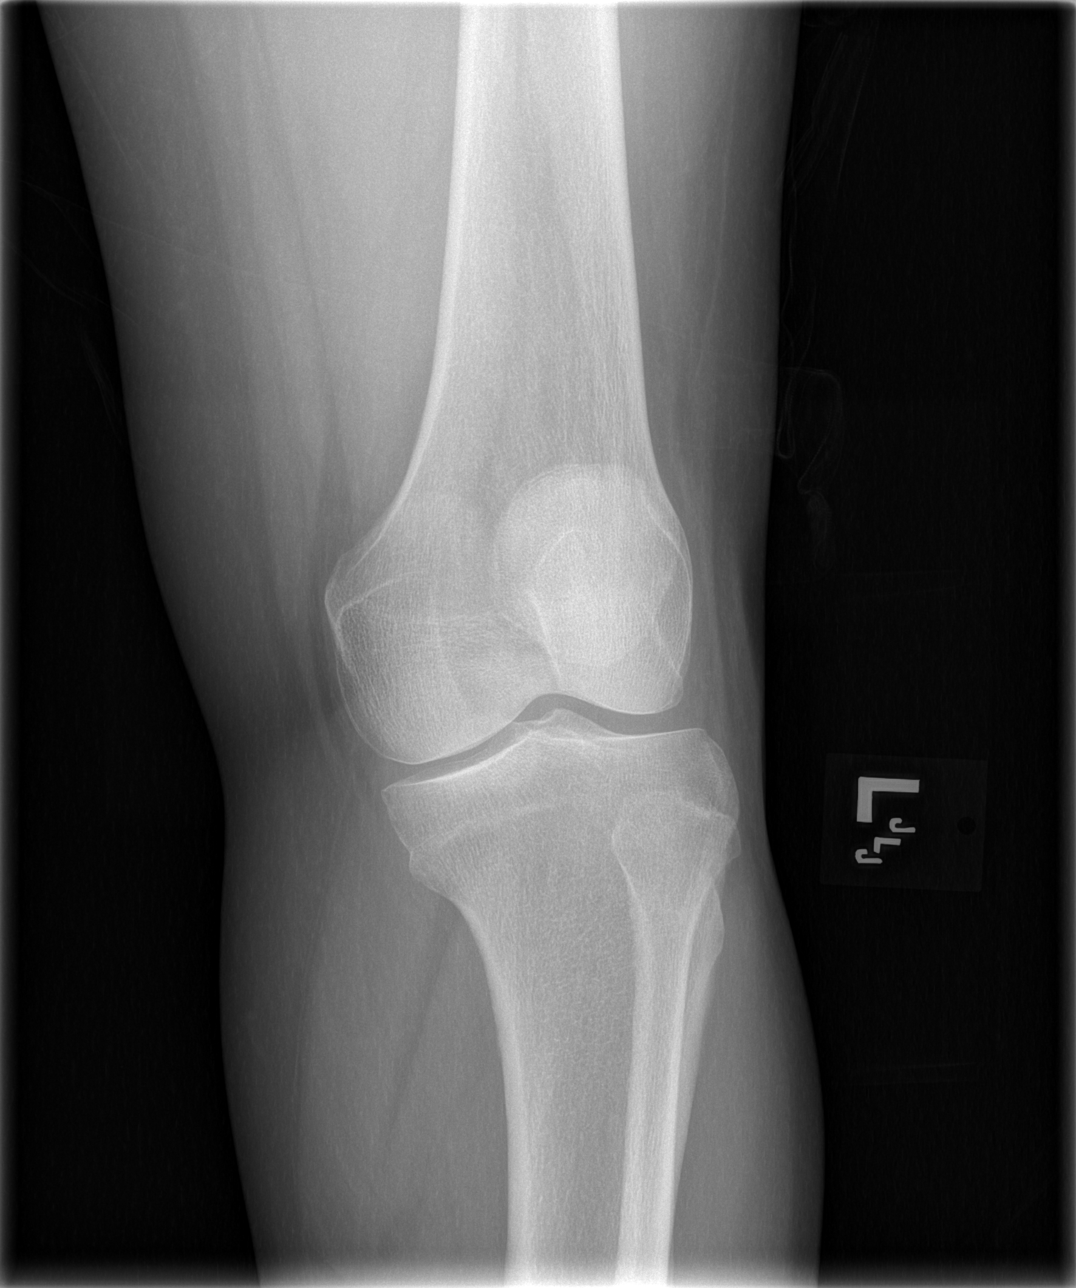

[t knee lat left]
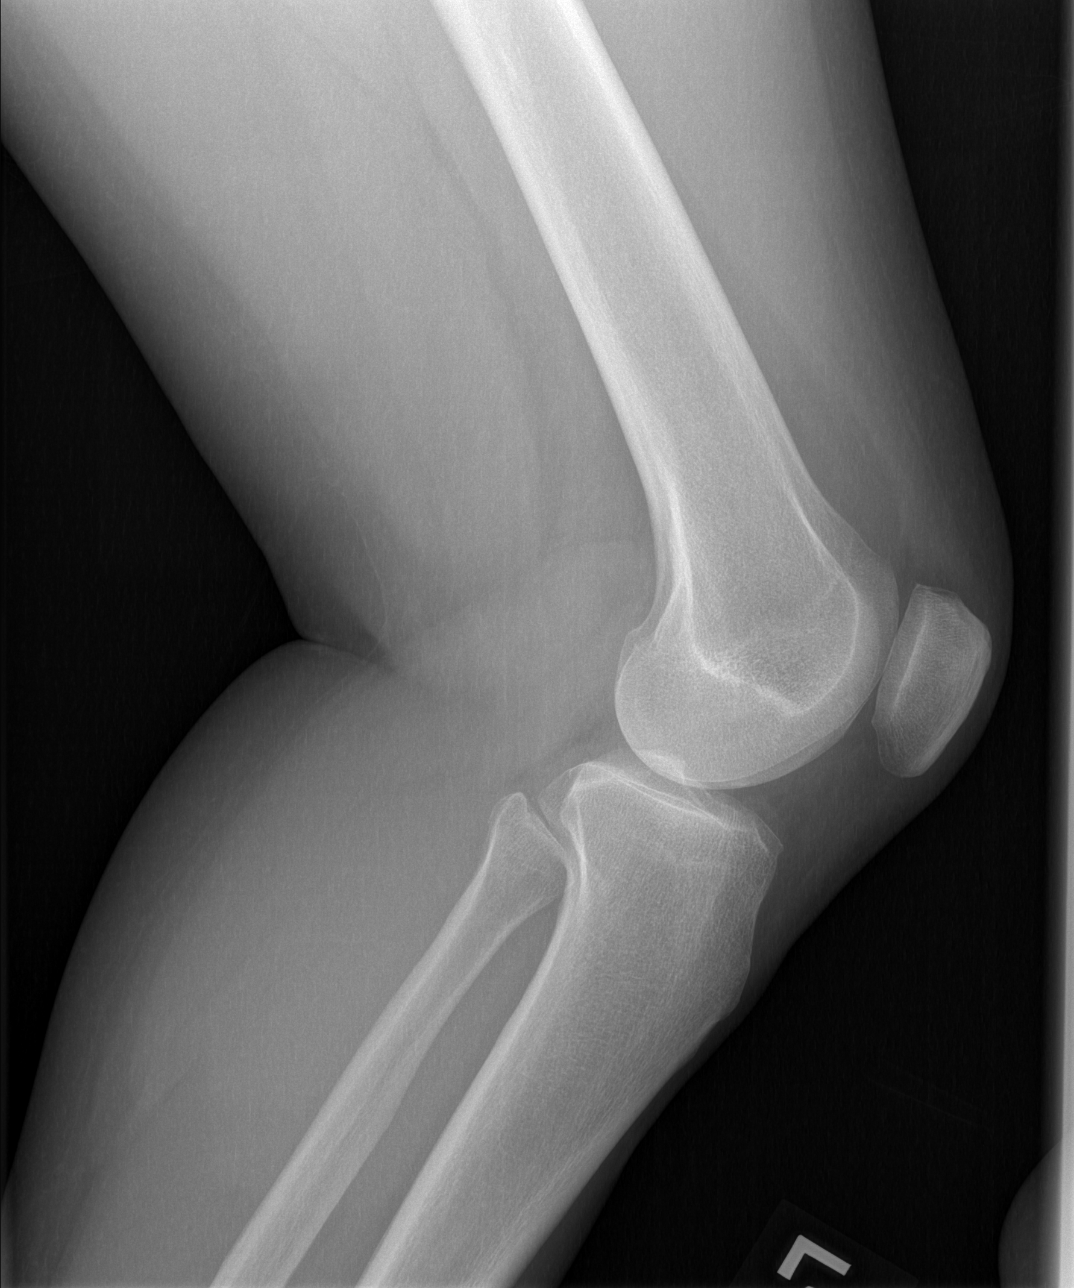

[4 of 4 positions shown; findings below may reference images not displayed]

FINDINGS: No acute fracture. No dislocation.  Unremarkable soft tissues.
IMPRESSION: No acute bony pathology.

## 2017-12-31 ENCOUNTER — Encounter: Attending: Family Medicine | Primary: Family Medicine

## 2018-01-23 ENCOUNTER — Encounter: Attending: Family Medicine | Primary: Family Medicine

## 2018-02-23 ENCOUNTER — Ambulatory Visit
Admit: 2018-02-23 | Discharge: 2018-02-23 | Payer: PRIVATE HEALTH INSURANCE | Attending: Family Medicine | Primary: Family Medicine

## 2018-02-23 DIAGNOSIS — Z0000 Encounter for general adult medical examination without abnormal findings: Secondary | ICD-10-CM

## 2018-02-23 MED ORDER — ESOMEPRAZOLE MAGNESIUM 40 MG CAP, DELAYED RELEASE
40 mg | ORAL_CAPSULE | Freq: Every day | ORAL | 5 refills | Status: AC
Start: 2018-02-23 — End: ?

## 2018-02-23 NOTE — Progress Notes (Signed)
Advance Care Planning (ACP) Provider Note - Comprehensive     Date of ACP Conversation: 02/23/18  Persons included in Conversation:  patient  Length of ACP Conversation in minutes:  16 minutes    Authorized Media planner (if patient is incapable of making informed decisions):   This person is:  Other Conservation officer, nature (e.g. Next of Kin)  dtr and son          General ACP for ALL Patients with Decision Making Capacity:   Importance of advance care planning, including choosing a healthcare agent to communicate patient's healthcare decisions if patient lost the ability to make decisions, such as after a sudden illness or accident  Understanding of the healthcare agent role was assessed and information provided  Exploration of values, goals, and preferences if recovery is not expected, even with continued medical treatment in the event of: Imminent death  Severe, permanent brain injury  "In these circumstances, what matters most to you?"  Care focused more on quantity (length) of life.    Review of Existing Advance Directive:  pt does not yet have an AD    For Serious or Chronic Illness:  Understanding of medical condition      Interventions Provided:  Recommended completion of Advance Directive form after review of ACP materials and conversation with prospective healthcare agent

## 2018-02-23 NOTE — Progress Notes (Signed)
Aahna Rossa presents today for   Chief Complaint   Patient presents with   ??? High Blood Sugar     follow up   ??? Hypertension   ??? Annual Wellness Visit   ??? Labs     completed 09/30/17       Alto [place] preferred language for health care discussion is english/other.    Is someone accompanying this pt? no    Is the patient using any DME equipment during [place]? no    Depression Screening:  3 most recent PHQ Screens 09/30/2017   PHQ Not Done -   Little interest or pleasure in doing things Not at all   Feeling down, depressed, irritable, or hopeless Not at all   Total Score PHQ 2 0   Trouble falling or staying asleep, or sleeping too much -   Feeling tired or having little energy -   Poor appetite, weight loss, or overeating -   Feeling bad about yourself - or that you are a failure or have let yourself or your family down -   Trouble concentrating on things such as school, work, reading, or watching TV -   Moving or speaking so slowly that other people could have noticed; or the opposite being so fidgety that others notice -   Thoughts of being better off dead, or hurting yourself in some way -   PHQ 9 Score -   How difficult have these problems made it for you to do your work, take care of your home and get along with others -       Learning Assessment:  Learning Assessment 02/23/2018   PRIMARY LEARNER Patient   HIGHEST LEVEL OF EDUCATION - PRIMARY LEARNER  SOME COLLEGE   BARRIERS PRIMARY LEARNER NONE   CO-LEARNER CAREGIVER No   PRIMARY LANGUAGE ENGLISH   LEARNER PREFERENCE PRIMARY LISTENING   ANSWERED BY self   RELATIONSHIP SELF       Abuse Screening:  Abuse Screening Questionnaire 09/30/2017   Do you ever feel afraid of your partner? N   Are you in a relationship with someone who physically or mentally threatens you? N   Is it safe for you to go home? Y       Health Maintenance Due   Topic Date Due   ??? Pneumococcal 0-64 years (1 of 1 - PPSV23) 06/09/1962   ??? PAP AKA CERVICAL CYTOLOGY  06/09/1977    ??? Shingrix Vaccine Age 21> (1 of 2) 06/09/2006   ??? BREAST CANCER SCRN MAMMOGRAM  06/09/2006   ??? MEDICARE YEARLY EXAM  08/27/2017   .      Health Maintenance reviewed and discussed and ordered per Provider.    Sakeenah Valcarcel is updated on all HM    Coordination of Care:  1. Have you been to the ER, urgent care clinic since your last visit?  Hospitalized since your last visit? no    2. Have you seen or consulted any other health care providers outside of the [facility] since your last visit?  Include any pap smears or colon screening. no      Advance Directive:  1. Do you have an advance directive in place? Patient Reply:no    2. If not, would you like material regarding how to put one in place? Patient Reply: no

## 2018-02-23 NOTE — Progress Notes (Signed)
This is an Initial Medicare Annual Wellness Exam (AWV) (Performed 12 months after IPPE or effective date of Medicare Part B enrollment, Once in a lifetime)    I have reviewed the patient's medical history in detail and updated the computerized patient record.     History     Past Medical History:   Diagnosis Date   ??? Arthritis    ??? Chronic obstructive pulmonary disease ([place])    ??? Depression    ??? Elevated glucose    ??? Glaucoma    ??? Gout    ??? HTN (hypertension)    ??? Joint pain       Past Surgical History:   Procedure Laterality Date   ??? COLONOSCOPY N/A 09/03/2017    COLONOSCOPY / polypectomy performed by Jannet Mantis, MD at [facility]   ??? HX ORTHOPAEDIC      Left hand repair due to work injury   ??? HX TUBAL LIGATION       Current Outpatient Medications   Medication Sig Dispense Refill   ??? alclometasone (ACLOVATE) 0.05 % topical cream APPLY 1 APPLICATION OF CREAM TOPICALLY TWICE DAILY TO AFFECTED AREA OF SKIN FOR SEVERE DAYS  1   ??? benzonatate (TESSALON) 100 mg capsule Take 1 capsule 3 times daily as needed for coughing, swallow capsules whole.     ??? fluticasone propionate (FLONASE) 50 mcg/actuation nasal spray 1 Spray.     ??? hydrOXYzine HCl (ATARAX) 25 mg tablet Take 25 mg by mouth.     ??? augmented betamethasone dipropionate (DIPROLENE-AF) 0.05 % topical cream Apply  to affected area.     ??? ALBUTEROL SULFATE IN Take 1 Puff by inhalation.     ??? gabapentin (NEURONTIN) 300 mg capsule Take 300 mg by mouth three (3) times daily.     ??? losartan (COZAAR) 50 mg tablet Take 1 Tab by mouth daily. 90 Tab 3     Allergies   Allergen Reactions   ??? Codeine Seizures     Other reaction(s): Other (See Comments)  seizures  Other reaction(s): neurological reaction   ??? Tomato Hives   ??? Lisinopril Cough     Family History   Problem Relation Age of Onset   ??? Stroke Mother    ??? Alzheimer Mother    ??? Cancer Father    ??? Diabetes Brother    ??? Dementia Brother      Social History     Tobacco Use    ??? Smoking status: Current Every Day Smoker   ??? Smokeless tobacco: Never Used   Substance Use Topics   ??? Alcohol use: Yes     Alcohol/week: 2.0 standard drinks     Types: 2 Cans of beer per week     There is no problem list on file for this patient.      Depression Risk Factor Screening:     3 most recent PHQ Screens 09/30/2017   PHQ Not Done -   Little interest or pleasure in doing things Not at all   Feeling down, depressed, irritable, or hopeless Not at all   Total Score PHQ 2 0   Trouble falling or staying asleep, or sleeping too much -   Feeling tired or having little energy -   Poor appetite, weight loss, or overeating -   Feeling bad about yourself - or that you are a failure or have let yourself or your family down -   Trouble concentrating on things such as school, work, reading,  or watching TV -   Moving or speaking so slowly that other people could have noticed; or the opposite being so fidgety that others notice -   Thoughts of being better off dead, or hurting yourself in some way -   PHQ 9 Score -   How difficult have these problems made it for you to do your work, take care of your home and get along with others -     Alcohol Risk Factor Screening:   You do not drink alcohol or very rarely. On any occasion in the past three months you have had more than 3 drinks containing alcohol.    Functional Ability and Level of Safety:     Hearing Loss  Hearing is good.    Activities of Daily Living  The home contains: no safety equipment.  Patient does total self care    Fall Risk  Fall Risk Assessment, last 12 mths 02/23/2018   Able to walk? Yes   Fall in past 12 months? Yes   Fall with injury? Yes   Number of falls in past 12 months 2   Fall Risk Score 3       Abuse Screen  Patient is not abused    Cognitive Screening   Evaluation of Cognitive Function:  Has your family/caregiver stated any concerns about your memory: no  Normal    Patient Care Team   Patient Care Team:   Albin Felling, MD as PCP - General (Family Practice)    Assessment/Plan   Education and counseling provided:  Are appropriate based on today's review and evaluation  End-of-Life planning (with patient's consent)  Pneumococcal Vaccine  Influenza Vaccine  Screening Mammography  Screening Pap and pelvic (covered once every 2 years)  shingles vaccination    Diagnoses and all orders for this visit:    1. Initial Medicare annual wellness visit    2. ACP (advance care planning)         Health Maintenance Due   Topic Date Due   ??? Pneumococcal 0-64 years (1 of 1 - PPSV23) 06/09/1962   ??? PAP AKA CERVICAL CYTOLOGY  06/09/1977   ??? Shingrix Vaccine Age 52> (1 of 2) 06/09/2006   ??? BREAST CANCER SCRN MAMMOGRAM  06/09/2006   ??? MEDICARE YEARLY EXAM  08/27/2017

## 2018-02-23 NOTE — Patient Instructions (Addendum)
Preventing Falls: Care Instructions  Your Care Instructions    Getting around your home safely can be a challenge if you have injuries or health problems that make it easy for you to fall. Loose rugs and furniture in walkways are among the dangers for many older people who have problems walking or who have poor eyesight. People who have conditions such as arthritis, osteoporosis, or dementia also have to be careful not to fall.  You can make your home safer with a few simple measures.  Follow-up care is a key part of your treatment and safety. Be sure to make and go to all appointments, and call your doctor if you are having problems. It's also a good idea to know your test results and keep a list of the medicines you take.  How can you care for yourself at home?  Taking care of yourself  ?? You may get dizzy if you do not drink enough water. To prevent dehydration, drink plenty of fluids, enough so that your urine is light yellow or clear like water. Choose water and other caffeine-free clear liquids. If you have kidney, heart, or liver disease and have to limit fluids, talk with your doctor before you increase the amount of fluids you drink.  ?? Exercise regularly to improve your strength, muscle tone, and balance. Walk if you can. Swimming may be a good choice if you cannot walk easily.  ?? Have your vision and hearing checked each year or any time you notice a change. If you have trouble seeing and hearing, you might not be able to avoid objects and could lose your balance.  ?? Know the side effects of the medicines you take. Ask your doctor or pharmacist whether the medicines you take can affect your balance. Sleeping pills or sedatives can affect your balance.  ?? Limit the amount of alcohol you drink. Alcohol can impair your balance and other senses.  ?? Ask your doctor whether calluses or corns on your feet need to be removed. If you wear loose-fitting shoes because of calluses or corns, you  can lose your balance and fall.  ?? Talk to your doctor if you have numbness in your feet.  Preventing falls at home  ?? Remove raised doorway thresholds, throw rugs, and clutter. Repair loose carpet or raised areas in the floor.  ?? Move furniture and electrical cords to keep them out of walking paths.  ?? Use nonskid floor wax, and wipe up spills right away, especially on ceramic tile floors.  ?? If you use a walker or cane, put rubber tips on it. If you use crutches, clean the bottoms of them regularly with an abrasive pad, such as steel wool.  ?? Keep your house well lit, especially stairways, porches, and outside walkways. Use night-lights in areas such as hallways and bathrooms. Add extra light switches or use remote switches (such as switches that go on or off when you clap your hands) to make it easier to turn lights on if you have to get up during the night.  ?? Install sturdy handrails on stairways.  ?? Move items in your cabinets so that the things you use a lot are on the lower shelves (about waist level).  ?? Keep a cordless phone and a flashlight with new batteries by your bed. If possible, put a phone in each of the main rooms of your house, or carry a cell phone in case you fall and cannot reach a phone. Or, you can   wear a device around your neck or wrist. You push a button that sends a signal for help.  ?? Wear low-heeled shoes that fit well and give your feet good support. Use footwear with nonskid soles. Check the heels and soles of your shoes for wear. Repair or replace worn heels or soles.  ?? Do not wear socks without shoes on wood floors.  ?? Walk on the grass when the sidewalks are slippery. If you live in an area that gets snow and ice in the winter, sprinkle salt on slippery steps and sidewalks.  Preventing falls in the bath  ?? Install grab bars and nonskid mats inside and outside your shower or tub and near the toilet and sinks.  ?? Use shower chairs and bath benches.   ?? Use a hand-held shower head that will allow you to sit while showering.  ?? Get into a tub or shower by putting the weaker leg in first. Get out of a tub or shower with your strong side first.  ?? Repair loose toilet seats and consider installing a raised toilet seat to make getting on and off the toilet easier.  ?? Keep your bathroom door unlocked while you are in the shower.  Where can you learn more?  Go to [email or web address].  Enter G117 in the search box to learn more about "Preventing Falls: Care Instructions."  Current as of: May 28, 2017  Content Version: 12.1  ?? 2006-2019 [employer]. Care instructions adapted under license by [employer] (which disclaims liability or warranty for this information). If you have questions about a medical condition or this instruction, always ask your healthcare professional. [place] any warranty or liability for your use of this information.      Medicare Wellness Visit, Female     The best way to live healthy is to have a lifestyle where you eat a well-balanced diet, exercise regularly, limit alcohol use, and quit all forms of tobacco/nicotine, if applicable.   Regular preventive services are another way to keep healthy. Preventive services (vaccines, screening tests, monitoring & exams) can help personalize your care plan, which helps you manage your own care. Screening tests can find health problems at the earliest stages, when they are easiest to treat.   [facility] follows the current, evidence-based guidelines published by the [place] [employer] (USPSTF) when recommending preventive services for our patients. Because we follow these guidelines, sometimes recommendations change over time as research supports it. (For example, mammograms used to be recommended annually. Even though Medicare will still pay for an annual mammogram, the newer  guidelines recommend a mammogram every two years for women of average risk.)  Of course, you and your doctor may decide to screen more often for some diseases, based on your risk and your health status.   Preventive services for you include:  - Medicare offers their members a free annual wellness visit, which is time for you and your primary care provider to discuss and plan for your preventive service needs. Take advantage of this benefit every year!  -All adults over the age of 32 should receive the recommended pneumonia vaccines. Current USPSTF guidelines recommend a series of two vaccines for the best pneumonia protection.   -All adults should have a flu vaccine yearly and a tetanus vaccine every 10 years. All adults age 71 and older should receive a shingles vaccine once in their lifetime.    -A bone mass density test is recommended when  a woman turns 68 to screen for osteoporosis. This test is only recommended one time, as a screening. Some providers will use this same test as a disease monitoring tool if you already have osteoporosis.  -All adults age 58-70 who are overweight should have a diabetes screening test once every three years.   -Other screening tests and preventive services for persons with diabetes include: an eye exam to screen for diabetic retinopathy, a kidney function test, a foot exam, and stricter control over your cholesterol.   -Cardiovascular screening for adults with routine risk involves an electrocardiogram (ECG) at intervals determined by your doctor.   -Colorectal cancer screenings should be done for adults age 82-75 with no increased risk factors for colorectal cancer.  There are a number of acceptable methods of screening for this type of cancer. Each test has its own benefits and drawbacks. Discuss with your doctor what is most appropriate for you during your annual wellness visit. The different tests include: colonoscopy (considered the best screening method), a fecal  occult blood test, a fecal DNA test, and sigmoidoscopy.  -Breast cancer screenings are recommended every other year for women of normal risk, age 12-74.  -Cervical cancer screenings for women over age 81 are only recommended with certain risk factors.   -All adults born between [place] should be screened once for Hepatitis C.     Here is a list of your current Health Maintenance items (your personalized list of preventive services) with a due date:  Health Maintenance Due   Topic Date Due   ??? Pneumococcal Vaccine (1 of 1 - PPSV23) 06/09/1962   ??? Pap Test  06/09/1977   ??? Shingles Vaccine (1 of 2) 06/09/2006   ??? Mammogram  06/09/2006   ??? Annual Well Visit  08/27/2017

## 2018-02-23 NOTE — Progress Notes (Signed)
Chief Complaint   Patient presents with   ??? High Blood Sugar     follow up   ??? Hypertension   ??? Annual Wellness Visit   ??? Labs     completed 09/30/17     HISTORY OF PRESENT ILLNESS  Kristen Ramsey is a 62 y.o. female.  HPI  Pt here for [place].      Pt also c/o stomach pain that is worse in the [facility].  She has some nausea.  She has not had heartburn.  Pt felt that coffee did aggravate it so now she drinks tea instead.      Pt also here for f/u of htn and ifg.  Labs from 09/30/17 reviewed.      Review of Systems   Constitutional: Negative.    HENT: Negative.    Respiratory: Negative.    Cardiovascular: Negative.    Gastrointestinal: Positive for abdominal pain and nausea. Negative for constipation, diarrhea, heartburn and vomiting.   All other systems reviewed and are negative.      Physical Exam  Physical Exam   Nursing note and vitals reviewed.  Constitutional: She is oriented to person, place, and time. She appears well-developed and well-nourished.   HENT:   Head: Normocephalic and atraumatic.   Right Ear: External ear normal.   Left Ear: External ear normal.   Nose: Nose normal.   Eyes: Conjunctivae and EOM are normal.   Neck: Normal range of motion. Neck supple. No JVD present. Carotid bruit is not present. No thyromegaly present.   Cardiovascular: Normal rate, regular rhythm, normal heart sounds and intact distal pulses.  Exam reveals no gallop and no friction rub.    No murmur heard.  Pulmonary/Chest: Effort normal and breath sounds normal. She has no wheezes. She has no rhonchi. She has no rales.   Abdominal: Soft. Bowel sounds are normal.   Mid-epigastric ttp  Musculoskeletal: Normal range of motion.   Neurological: She is alert and oriented to person, place, and time. Coordination normal.   Skin: Skin is warm and dry.   Psychiatric: She has a normal mood and affect. Her behavior is normal. Judgment and thought content normal.     ASSESSMENT and PLAN  Diagnoses and all orders for this visit:     1.  Essential hypertension  Stable, cont pres tx plan.     2. Hyperlipidemia, unspecified hyperlipidemia type  Elevated.  Work on diet.     3. IFG (impaired fasting glucose)  -     METABOLIC PANEL, COMPREHENSIVE; Future  -     LIPID PANEL; Future  -     HEMOGLOBIN A1C WITH EAG; Future    4. Gastroesophageal reflux disease, esophagitis presence not specified  -     Trial: esomeprazole (NEXIUM) 40 mg capsule; Take 1 Cap by mouth daily.  Will call if no improvement in stomach pain; consider referral to gi if no change.    Follow-up and Dispositions    ?? Return in about 3 months (around 05/26/2018) for elevated fasting blood sugar, high blood pressure, gerd, high cholesterol.

## 2018-05-26 ENCOUNTER — Ambulatory Visit
Admit: 2018-05-26 | Discharge: 2018-05-26 | Payer: PRIVATE HEALTH INSURANCE | Attending: Family Medicine | Primary: Family Medicine

## 2018-05-26 ENCOUNTER — Ambulatory Visit: Attending: Family Medicine

## 2018-05-26 DIAGNOSIS — I10 Essential (primary) hypertension: Secondary | ICD-10-CM

## 2018-05-26 MED ORDER — LOSARTAN 50 MG TAB
50 mg | ORAL_TABLET | Freq: Every day | ORAL | 3 refills | Status: AC
Start: 2018-05-26 — End: ?

## 2018-05-26 MED ORDER — ALBUTEROL SULFATE HFA 90 MCG/ACTUATION AEROSOL INHALER
90 mcg/actuation | RESPIRATORY_TRACT | 3 refills | Status: DC | PRN
Start: 2018-05-26 — End: 2018-05-27

## 2018-05-26 NOTE — Progress Notes (Addendum)
Kristen Ramsey presents today for   Chief Complaint   Patient presents with   ??? Blood sugar problem     follow up   ??? Hypertension   ??? GERD   ??? Cholesterol Problem   ??? Medication Refill   ??? Referral Request     Dentist       Kristen Ramsey preferred language for health care discussion is english/other.    Is someone accompanying this pt? no    Is the patient using any DME equipment during [place]? no    Depression Screening:  3 most recent PHQ Screens 09/30/2017   PHQ Not Done -   Little interest or pleasure in doing things Not at all   Feeling down, depressed, irritable, or hopeless Not at all   Total Score PHQ 2 0   Trouble falling or staying asleep, or sleeping too much -   Feeling tired or having little energy -   Poor appetite, weight loss, or overeating -   Feeling bad about yourself - or that you are a failure or have let yourself or your family down -   Trouble concentrating on things such as school, work, reading, or watching TV -   Moving or speaking so slowly that other people could have noticed; or the opposite being so fidgety that others notice -   Thoughts of being better off dead, or hurting yourself in some way -   PHQ 9 Score -   How difficult have these problems made it for you to do your work, take care of your home and get along with others -       Learning Assessment:  Learning Assessment 02/23/2018   PRIMARY LEARNER Patient   HIGHEST LEVEL OF EDUCATION - PRIMARY LEARNER  SOME COLLEGE   BARRIERS PRIMARY LEARNER NONE   CO-LEARNER CAREGIVER No   PRIMARY LANGUAGE ENGLISH   LEARNER PREFERENCE PRIMARY LISTENING   ANSWERED BY self   RELATIONSHIP SELF       Abuse Screening:  Abuse Screening Questionnaire 09/30/2017   Do you ever feel afraid of your partner? N   Are you in a relationship with someone who physically or mentally threatens you? N   Is it safe for you to go home? Y       Generalized Anxiety  No flowsheet data found.      Health Maintenance Due   Topic Date Due    ??? Pneumococcal 0-64 years (1 of 1 - PPSV23) 06/09/1962   ??? PAP AKA CERVICAL CYTOLOGY  06/09/1977   ??? Shingrix Vaccine Age 32> (1 of 2) 06/09/2006   ??? BREAST CANCER SCRN MAMMOGRAM  06/09/2006   ??? Influenza Age 23 to Adult  02/19/2018   .      Health Maintenance reviewed and discussed and ordered per Provider.    Kristen Ramsey is updated on all HM    Coordination of Care:  1. Have you been to the ER, urgent care clinic since your last visit?  Hospitalized since your last visit? no    2. Have you seen or consulted any other health care providers outside of the [facility] since your last visit?  Include any pap smears or colon screening. no      Advance Directive:  1. Do you have an advance directive in place? Patient Reply:no    2. If not, would you like material regarding how to put one in place? Patient Reply: no    Kristen Ramsey [birth year]  female who presents for routine immunizations.   Patient denies any symptoms , reactions or allergies that would exclude them from being immunized today.  Risks and adverse reactions were discussed and the VIS was given to them. All questions were addressed.  Order placed for Flu,  per Verbal Order from Dr. Berenice Primas with read back.  Patient was observed for 15 min post injection. There were no reactions observed.    Kristen Nesbitt CainesLPN

## 2018-05-26 NOTE — Progress Notes (Signed)
Chief Complaint   Patient presents with   ??? Blood sugar problem     follow up   ??? Hypertension   ??? GERD   ??? Cholesterol Problem   ??? Medication Refill   ??? Referral Request     Dentist     HPI    Kristen Ramsey is a 62 y.o. female presenting today for follow up of htn, ifg, gerd, hld.    Pt is using nexium and has altered her diet.  She reports that she awakens with nausea some mornings.      No recent labs.      Patient does need medication refills today.      Review of Systems   Constitutional: Negative.    HENT: Negative.    Respiratory: Negative.    Cardiovascular: Negative.    Gastrointestinal: Positive for nausea.   All other systems reviewed and are negative.      Physical Exam  Physical Exam   Nursing note and vitals reviewed.  Constitutional: She is oriented to person, place, and time. She appears well-developed and well-nourished.   HENT:   Head: Normocephalic and atraumatic.   Right Ear: External ear normal.   Left Ear: External ear normal.   Nose: Nose normal.   Eyes: Conjunctivae and EOM are normal.   Neck: Normal range of motion. Neck supple. No JVD present. Carotid bruit is not present. No thyromegaly present.   Cardiovascular: Normal rate, regular rhythm, normal heart sounds and intact distal pulses.  Exam reveals no gallop and no friction rub.    No murmur heard.  Pulmonary/Chest: Effort normal and breath sounds normal. She has no wheezes. She has no rhonchi. She has no rales.   Abdominal: Soft. Bowel sounds are normal.   Musculoskeletal: Normal range of motion.   Neurological: She is alert and oriented to person, place, and time. Coordination normal.   Skin: Skin is warm and dry.   Psychiatric: She has a normal mood and affect. Her behavior is normal. Judgment and thought content normal.     Diagnoses and all orders for this visit:    1. Essential hypertension  -     losartan (COZAAR) 50 mg tablet; Take 1 Tab by mouth daily.  Stable, cont pres tx plan.   Labs pending     2. Hyperlipidemia, unspecified hyperlipidemia type  Labs pending    3. IFG (impaired fasting glucose)  Labs pending    4. Gastroesophageal reflux disease, esophagitis presence not specified  -     REFERRAL TO GASTROENTEROLOGY    5. Nausea  -     REFERRAL TO GASTROENTEROLOGY    Other orders  -     albuterol (PROVENTIL HFA, VENTOLIN HFA, PROAIR HFA) 90 mcg/actuation inhaler; Take 2 Puffs by inhalation every four (4) hours as needed for Shortness of Breath.      Follow-up and Dispositions    ?? Return in about 3 months (around 08/26/2018) for high blood pressure, elevated fasting blood sugar, high cholesterol, gerd.     pt may be moving out of the area after the start of the new year.

## 2018-05-27 ENCOUNTER — Other Ambulatory Visit
Admit: 2018-05-27 | Discharge: 2018-05-27 | Payer: PRIVATE HEALTH INSURANCE | Attending: Family Medicine | Primary: Family Medicine

## 2018-05-27 ENCOUNTER — Inpatient Hospital Stay: Admit: 2018-05-27 | Payer: MEDICARE | Primary: Family Medicine

## 2018-05-27 DIAGNOSIS — Z0189 Encounter for other specified special examinations: Secondary | ICD-10-CM

## 2018-05-27 DIAGNOSIS — I10 Essential (primary) hypertension: Secondary | ICD-10-CM

## 2018-05-27 LAB — METABOLIC PANEL, COMPREHENSIVE
A-G Ratio: 1 (ref 0.8–1.7)
ALT (SGPT): 14 U/L (ref 13–56)
AST (SGOT): 11 U/L (ref 10–38)
Albumin: 3.5 g/dL (ref 3.4–5.0)
Alk. phosphatase: 69 U/L (ref 45–117)
Anion gap: 3 mmol/L (ref 3.0–18)
BUN/Creatinine ratio: 8 — ABNORMAL LOW (ref 12–20)
BUN: 7 MG/DL (ref 7.0–18)
Bilirubin, total: 0.3 MG/DL (ref 0.2–1.0)
CO2: 31 mmol/L (ref 21–32)
Calcium: 9.1 MG/DL (ref 8.5–10.1)
Chloride: 107 mmol/L (ref 100–111)
Creatinine: 0.88 MG/DL (ref 0.6–1.3)
GFR est AA: >60 mL/min/{1.73_m2} (ref >60)
GFR est non-AA: >60 mL/min/{1.73_m2} (ref >60)
Globulin: 3.6 g/dL (ref 2.0–4.0)
Glucose: 89 mg/dL (ref 74–99)
Potassium: 4.9 mmol/L (ref 3.5–5.5)
Protein, total: 7.1 g/dL (ref 6.4–8.2)
Sodium: 141 mmol/L (ref 136–145)

## 2018-05-27 MED ORDER — ALBUTEROL SULFATE HFA 90 MCG/ACTUATION AEROSOL INHALER
90 mcg/actuation | RESPIRATORY_TRACT | 3 refills | Status: AC | PRN
Start: 2018-05-27 — End: ?

## 2018-05-27 NOTE — Telephone Encounter (Signed)
Patient pharmacy called an stated what is the frequency on Albuterol inhaler 90 mcg 1 puff as need? An pharmacy wanted a new prescription with the frequency on it.

## 2018-05-27 NOTE — Progress Notes (Signed)
Patient in today for lab draw. Labs ordered by PCP Ree Edman at this time. Venipuncture to right forearm at this time. Patient tolerated well. Labs will be sent to [facility] for testing at this time.Labs collected CMP,LIPID,AND A1C . 1 lavender and 1 Gold collected

## 2018-05-28 LAB — LIPID PANEL
CHOL/HDL Ratio: 3.4 (ref 0–5.0)
Cholesterol, total: 198 MG/DL (ref <200)
HDL Cholesterol: 58 MG/DL (ref 40–60)
LDL, calculated: 127.4 MG/DL — ABNORMAL HIGH (ref 0–100)
Triglyceride: 63 MG/DL (ref <150)
VLDL, calculated: 12.6 MG/DL

## 2018-05-28 LAB — HEMOGLOBIN A1C WITH EAG
Est. average glucose: 134 mg/dL
Hemoglobin A1c: 6.3 % — ABNORMAL HIGH (ref 4.2–5.6)

## 2018-06-02 ENCOUNTER — Ambulatory Visit
Admit: 2018-06-02 | Discharge: 2018-06-02 | Payer: PRIVATE HEALTH INSURANCE | Attending: Family Medicine | Primary: Family Medicine

## 2018-06-02 DIAGNOSIS — Z01419 Encounter for gynecological examination (general) (routine) without abnormal findings: Secondary | ICD-10-CM

## 2018-06-02 NOTE — Patient Instructions (Addendum)
High Blood Pressure: Care Instructions  Overview    It's normal for blood pressure to go up and down throughout the day. But if it stays up, you have high blood pressure. Another name for high blood pressure is hypertension.  Despite what a lot of people think, high blood pressure usually doesn't cause headaches or make you feel dizzy or lightheaded. It usually has no symptoms. But it does increase your risk of stroke, heart attack, and other problems. You and your doctor will talk about your risks of these problems based on your blood pressure.  Your doctor will give you a goal for your blood pressure. Your goal will be based on your health and your age.  Lifestyle changes, such as eating healthy and being active, are always important to help lower blood pressure. You might also take medicine to reach your blood pressure goal.  Follow-up care is a key part of your treatment and safety. Be sure to make and go to all appointments, and call your doctor if you are having problems. It's also a good idea to know your test results and keep a list of the medicines you take.  How can you care for yourself at home?  Medical treatment  ?? If you stop taking your medicine, your blood pressure will go back up. You may take one or more types of medicine to lower your blood pressure. Be safe with medicines. Take your medicine exactly as prescribed. Call your doctor if you think you are having a problem with your medicine.  ?? Talk to your doctor before you start taking aspirin every day. Aspirin can help certain people lower their risk of a heart attack or stroke. But taking aspirin isn't right for everyone, because it can cause serious bleeding.  ?? See your doctor regularly. You may need to see the doctor more often at first or until your blood pressure comes down.  ?? If you are taking blood pressure medicine, talk to your doctor before you take decongestants or anti-inflammatory medicine, such as ibuprofen.  Some of these medicines can raise blood pressure.  ?? Learn how to check your blood pressure at home.  Lifestyle changes  ?? Stay at a healthy weight. This is especially important if you put on weight around the waist. Losing even 10 pounds can help you lower your blood pressure.  ?? If your doctor recommends it, get more exercise. Walking is a good choice. Bit by bit, increase the amount you walk every day. Try for at least 30 minutes on most days of the week. You also may want to swim, bike, or do other activities.  ?? Avoid or limit alcohol. Talk to your doctor about whether you can drink any alcohol.  ?? Try to limit how much sodium you eat to less than 2,300 milligrams (mg) a day. Your doctor may ask you to try to eat less than 1,500 mg a day.  ?? Eat plenty of fruits (such as bananas and oranges), vegetables, legumes, whole grains, and low-fat dairy products.  ?? Lower the amount of saturated fat in your diet. Saturated fat is found in animal products such as milk, cheese, and meat. Limiting these foods may help you lose weight and also lower your risk for heart disease.  ?? Do not smoke. Smoking increases your risk for heart attack and stroke. If you need help quitting, talk to your doctor about stop-smoking programs and medicines. These can increase your chances of quitting for good.  When   should you call for help?  Call  911 anytime you think you may need emergency care. This may mean having symptoms that suggest that your blood pressure is causing a serious heart or blood vessel problem. Your blood pressure may be over 180/120.  ??For example, call  911 if:  ?? ?? You have symptoms of a heart attack. These may include:  ? Chest pain or pressure, or a strange feeling in the chest.  ? Sweating.  ? Shortness of breath.  ? Nausea or vomiting.  ? Pain, pressure, or a strange feeling in the back, neck, jaw, or upper belly or in one or both shoulders or arms.  ? Lightheadedness or sudden weakness.   ? A fast or irregular heartbeat.   ?? ?? You have symptoms of a stroke. These may include:  ? Sudden numbness, tingling, weakness, or loss of movement in your face, arm, or leg, especially on only one side of your body.  ? Sudden vision changes.  ? Sudden trouble speaking.  ? Sudden confusion or trouble understanding simple statements.  ? Sudden problems with walking or balance.  ? A sudden, severe headache that is different from past headaches.   ?? ?? You have severe back or belly pain.   ??Do not wait until your blood pressure comes down on its own. Get help right away.  ??Call your doctor now or seek immediate care if:  ?? ?? Your blood pressure is much higher than normal (such as 180/120 or higher), but you don't have symptoms.   ?? ?? You think high blood pressure is causing symptoms, such as:  ? Severe headache.  ? Blurry vision.   ??Watch closely for changes in your health, and be sure to contact your doctor if:  ?? ?? Your blood pressure measures higher than your doctor recommends at least 2 times. That means the top number is higher or the bottom number is higher, or both.   ?? ?? You think you may be having side effects from your blood pressure medicine.   Where can you learn more?  Go to http://www.healthwise.net/[facility]ctions.  Enter X567 in the search box to learn more about "High Blood Pressure: Care Instructions."  Current as of: October 28, 2017  Content Version: 12.2  ?? 2006-2019 [employer]. Care instructions adapted under license by [employer] (which disclaims liability or warranty for this information). If you have questions about a medical condition or this instruction, always ask your healthcare professional. Healthwise, Inc[employer] disclaims any warranty or liability for your use of this information.      Vaccine Information Statement    Influenza (Flu) Vaccine (Inactivated or Recombinant): What You Need to Know     Many Vaccine Information Statements are available in Spanish and other languages. See www.immunize.org/vis  Hojas de informaci??n sobre vacunas est??n disponibles en espa??ol y en muchos otros idiomas. Visite www.immunize.org/vis    1. Why get vaccinated?    Influenza vaccine can prevent influenza (flu).    Flu is a contagious disease that spreads around the [place] every year, usually between October and May. Anyone can get the flu, but it is more dangerous for some people. Infants and young children, people 65 years of age and older, pregnant women, and people with certain health conditions or a weakened immune system are at greatest risk of flu complications.    Pneumonia, bronchitis, sinus infections and ear infections are examples of flu-related complications. If you have a medical condition, such as   heart disease, cancer or diabetes, flu can make it worse.    Flu can cause fever and chills, sore throat, muscle aches, fatigue, cough, headache, and runny or stuffy nose. Some people may have vomiting and diarrhea, though this is more common in children than adults.     Each year thousands of people in the [place] die from flu, and many more are hospitalized. Flu vaccine prevents millions of illnesses and flu-related visits to the doctor each year.    2. Influenza vaccines     CDC recommends everyone 6 months of age and older get vaccinated every flu season. Children 6 months through 8 years of age may need 2 doses during a single flu season.  Everyone else needs only 1 dose each flu season.    It takes about 2 weeks for protection to develop after vaccination.    There are many flu viruses, and they are always changing. Each year a new flu vaccine is made to protect against three or four viruses that are likely to cause disease in the upcoming flu season. Even when the vaccine doesn???t exactly match these viruses, it may still provide some protection.     Influenza vaccine does not cause flu.     Influenza vaccine may be given at the same time as other vaccines.    3. Talk with your health care provider    Tell your vaccine provider if the person getting the vaccine:  ??? Has had an allergic reaction after a previous dose of influenza vaccine, or has any severe, life-threatening allergies.   ??? Has ever had Guillain-Barr?? Syndrome (also called GBS).    In some cases, your health care provider may decide to postpone influenza vaccination to a future visit.    People with minor illnesses, such as a cold, may be vaccinated. People who are moderately or severely ill should usually wait until they recover before getting influenza vaccine.    Your health care provider can give you more information.    4. Risks of a reaction    ??? Soreness, redness, and swelling where shot is given, fever, muscle aches, and headache can happen after influenza vaccine.  ??? There may be a very small increased risk of Guillain-Barr?? Syndrome (GBS) after inactivated influenza vaccine (the flu shot).    Young children who get the flu shot along with pneumococcal vaccine (PCV13), and/or DTaP vaccine at the same time might be slightly more likely to have a seizure caused by fever. Tell your health care provider if a child who is getting flu vaccine has ever had a seizure.    People sometimes faint after medical procedures, including vaccination. Tell your provider if you feel dizzy or have vision changes or ringing in the ears.    As with any medicine, there is a very remote chance of a vaccine causing a severe allergic reaction, other serious injury, or death.    5. What if there is a serious problem?    An allergic reaction could occur after the vaccinated person leaves the clinic. If you see signs of a severe allergic reaction (hives, swelling of the face and throat, difficulty breathing, a fast heartbeat, dizziness, or weakness), call 9-1-1 and get the person to the nearest hospital.     For other signs that concern you, call your health care provider.    Adverse reactions should be reported to the Vaccine Adverse Event Reporting System (VAERS). Your health care provider will usually file this report,   or you can do it yourself. Visit the VAERS website at www.vaers.hhs.gov or call 1-800-822-7967.  VAERS is only for reporting reactions, and VAERS staff do not give medical advice.    6. The National Vaccine Injury Compensation Program    The National Vaccine Injury Compensation Program (VICP) is a federal program that was created to compensate people who may have been injured by certain vaccines. Visit the VICP website at www.hrsa.gov/vaccinecompensation or call 1-800-338-2382 to learn about the program and about filing a claim. There is a time limit to file a claim for compensation.    7. How can I learn more?    ??? Ask your health care provider.   ??? Call your local or state health department.  ??? Contact the Centers for Disease Control and Prevention (CDC):  - Call 1-800-232-4636 (1-800-CDC-INFO) or  - Visit CDC???s influenza website at www.cdc.gov/flu    Vaccine Information Statement (Interim)  Inactivated Influenza Vaccine   03/05/2018  42 U[place] ?? 300aa-[id number]   Department of Health and Human Services  Centers for Disease Control and Prevention    Office Use Only

## 2018-06-02 NOTE — Progress Notes (Signed)
SUBJECTIVE:   62 y.o. female for annual routine Pap and checkup.    Pt requests sti screening.      Current Outpatient Medications   Medication Sig Dispense Refill   ??? albuterol (PROVENTIL HFA, VENTOLIN HFA, PROAIR HFA) 90 mcg/actuation inhaler Take 2 Puffs by inhalation every four (4) hours as needed for Shortness of Breath. 1 Inhaler 3   ??? losartan (COZAAR) 50 mg tablet Take 1 Tab by mouth daily. 90 Tab 3   ??? alclometasone (ACLOVATE) 0.05 % topical cream APPLY 1 APPLICATION OF CREAM TOPICALLY TWICE DAILY TO AFFECTED AREA OF SKIN FOR SEVERE DAYS  1   ??? benzonatate (TESSALON) 100 mg capsule Take 1 capsule 3 times daily as needed for coughing, swallow capsules whole.     ??? fluticasone propionate (FLONASE) 50 mcg/actuation nasal spray 1 Spray.     ??? hydrOXYzine HCl (ATARAX) 25 mg tablet Take 25 mg by mouth.     ??? augmented betamethasone dipropionate (DIPROLENE-AF) 0.05 % topical cream Apply  to affected area.     ??? esomeprazole (NEXIUM) 40 mg capsule Take 1 Cap by mouth daily. 30 Cap 5   ??? gabapentin (NEURONTIN) 300 mg capsule Take 300 mg by mouth three (3) times daily.       Allergies: Codeine; Tomato; and Lisinopril   No LMP recorded. (Menstrual status: Menopause).    ROS:  Feeling well. No dyspnea or chest pain on exertion.  No abdominal pain, change in bowel habits, black or bloody stools.  No urinary tract symptoms. GYN ROS: no breast pain or new or enlarging lumps on self exam, no vaginal bleeding. No neurological complaints.    OBJECTIVE:   Visit Vitals  BP (!) 159/116 (BP 1 Location: Left arm, BP Patient Position: Sitting)   Pulse 68   Temp 98.7 ??F (37.1 ??C) (Oral)   Resp 18   Ht 5\' 6"  (1.676 m)   Wt 155 lb 9.6 oz (70.6 kg)   BMI 25.11 kg/m??       GEN:The patient appears well, in no distress.  EYES: conjunctiva/lids no edema, no erythema, no injection, no icterus  ENT normal ext ears. Normal ext nose.  No thyromegaly.   Lungs: nl respiratory effort  Extremities show no edema  Neurological: alert, oriented x 3     BREAST EXAM: breasts appear normal, no suspicious masses, no skin or nipple changes or axillary nodes    PELVIC EXAM: normal external genitalia, vulva, vagina, cervix, uterus and adnexa    ASSESSMENT:   well woman    PLAN:   mammogram  pap smear  additional lab tests per orders  return annually or prn      ICD-10-CM ICD-9-CM    1. Well woman exam with routine gynecological exam Z01.419 V72.31 PAP + HPV DNA (HIGH RISK)      CHLAMYDIA TRACHOMATIS, NAA THINPREP      CHLAMYDIA/GC AMPLIFICATON THINPREP      NEISSERIA GONORRHOEAE, NAA THINPREP   2. Possible exposure to STD Z20.2 V01.6 HEPATITIS PANEL, ACUTE      HIV 1/2 AG/AB, 4TH GENERATION,W RFLX CONFIRM      RPR      T PALLIDUM AB, BY FTA-ABS   3. Encounter for screening mammogram for malignant neoplasm of breast Z12.31 V76.12 MAM MAMMO BI SCREENING INCL CAD   4. Encounter for immunization Z23 V03.89

## 2018-06-02 NOTE — Progress Notes (Signed)
I have attempted without success to contact this patient by phone to discuss lab results and I left a message on answering machine stating to call the office to discuss labs.

## 2018-06-02 NOTE — Progress Notes (Signed)
Called patient and chart review was done. Patient was advised per- Dr. Berenice Primas that her labs are normal. Patient stated that she understand.

## 2018-06-02 NOTE — Progress Notes (Addendum)
Kristen Ramsey presents today for   Chief Complaint   Patient presents with   ??? Well Woman       Kristen Ramsey preferred language for health care discussion is english/other.    Is someone accompanying this pt? no    Is the patient using any DME equipment during [facility]? no    Depression Screening:  3 most recent PHQ Screens 09/30/2017   PHQ Not Done -   Little interest or pleasure in doing things Not at all   Feeling down, depressed, irritable, or hopeless Not at all   Total Score PHQ 2 0   Trouble falling or staying asleep, or sleeping too much -   Feeling tired or having little energy -   Poor appetite, weight loss, or overeating -   Feeling bad about yourself - or that you are a failure or have let yourself or your family down -   Trouble concentrating on things such as school, work, reading, or watching TV -   Moving or speaking so slowly that other people could have noticed; or the opposite being so fidgety that others notice -   Thoughts of being better off dead, or hurting yourself in some way -   PHQ 9 Score -   How difficult have these problems made it for you to do your work, take care of your home and get along with others -       Learning Assessment:  Learning Assessment 02/23/2018   PRIMARY LEARNER Patient   HIGHEST LEVEL OF EDUCATION - PRIMARY LEARNER  SOME COLLEGE   BARRIERS PRIMARY LEARNER NONE   CO-LEARNER CAREGIVER No   PRIMARY LANGUAGE ENGLISH   LEARNER PREFERENCE PRIMARY LISTENING   ANSWERED BY self   RELATIONSHIP SELF       Abuse Screening:  Abuse Screening Questionnaire 09/30/2017   Do you ever feel afraid of your partner? N   Are you in a relationship with someone who physically or mentally threatens you? N   Is it safe for you to go home? Y       Generalized Anxiety  No flowsheet data found.      Health Maintenance Due   Topic Date Due   ??? Pneumococcal 0-64 years (1 of 1 - PPSV23) [birth year]/1962   ??? PAP AKA CERVICAL CYTOLOGY  06/09/1977   ??? Shingrix Vaccine Age 11> (1 of 2) 06/09/2006    ??? BREAST CANCER SCRN MAMMOGRAM  06/09/2006   ??? Influenza Age 42 to Adult  02/19/2018   .      Health Maintenance reviewed and discussed and ordered per Provider.    Kristen Ramsey is updated on all HM    Coordination of Care:  1. Have you been to the ER, urgent care clinic since your last visit?  Hospitalized since your last visit? no    2. Have you seen or consulted any other health care providers outside of the [facility] since your last visit?  Include any pap smears or colon screening. no      Advance Directive:  1. Do you have an advance directive in place? Patient Reply:no    2. If not, would you like material regarding how to put one in place? Patient Reply: no    Kristen Ramsey 1956-01-27 female who presents for routine immunizations.   Patient denies any symptoms , reactions or allergies that would exclude them from being immunized today.  Risks and adverse reactions were discussed and the VIS was  given to them. All questions were addressed.  Order placed for Flu,  per Verbal Order from Dr. Berenice Primas with read back.  Patient was observed for 15 min post injection. There were no reactions observed.    Kristen Corallo CainesLPN

## 2018-06-02 NOTE — Progress Notes (Signed)
Please notify pt of nl results.

## 2018-06-03 ENCOUNTER — Inpatient Hospital Stay: Admit: 2018-06-03 | Payer: MEDICARE | Primary: Family Medicine

## 2018-06-03 LAB — HEPATITIS PANEL, ACUTE
Hep B surface Ag Interp.: NEGATIVE
Hep C virus Ab Interp.: NEGATIVE
Hepatitis A, IgM: NEGATIVE
Hepatitis B core, IgM: NEGATIVE
Hepatitis B surface Ag: <0.1 Index (ref <1.00)
Hepatitis C virus Ab: 0.06 Index (ref <0.80)

## 2018-06-03 LAB — HIV 1/2 AG/AB, 4TH GENERATION,W RFLX CONFIRM: HIV 1/2 Interpretation: NONREACTIVE

## 2018-06-03 LAB — T PALLIDUM AB: T. pallidum interpretation.: REACTIVE — AB

## 2018-06-04 LAB — RPR: RPR: NONREACTIVE

## 2018-06-05 NOTE — Progress Notes (Signed)
[facility] health screening:    Negative pap and colonoscopy now updated in HM. Awaiting completion of mammogram ordered this month.

## 2018-07-27 ENCOUNTER — Encounter

## 2018-08-17 ENCOUNTER — Encounter

## 2018-09-01 ENCOUNTER — Ambulatory Visit
Admit: 2018-09-01 | Discharge: 2018-09-01 | Payer: PRIVATE HEALTH INSURANCE | Attending: Family Medicine | Primary: Family Medicine

## 2018-09-01 DIAGNOSIS — I10 Essential (primary) hypertension: Secondary | ICD-10-CM

## 2018-09-01 NOTE — Progress Notes (Signed)
Chief Complaint   Patient presents with   ??? Hypertension     follow up   ??? Blood sugar problem   ??? Cholesterol Problem   ??? GERD   ??? Labs     completed 05/27/18         HPI    Kristen Ramsey is a 63 y.o. female presenting today for  3 months  follow up of htn, ifg, hld, gerd.  Pt has decided to stay in the area for another year.  She has moved to an apt that she feels is safer.      Patient had labs on 05/27/18.  Labs reviewed in detail with patient     Patient does not need medication refills today.      New concerns today: none      ROS  Review of Systems   Constitutional: Negative.    HENT: Negative.    Respiratory: Negative.    Cardiovascular: Negative.    All other systems reviewed and are negative.    Physical Exam  Physical Exam   Nursing note and vitals reviewed.  Constitutional: She is oriented to person, place, and time. She appears well-developed and well-nourished.   HENT:   Head: Normocephalic and atraumatic.   Right Ear: External ear normal.   Left Ear: External ear normal.   Nose: Nose normal.   Eyes: Conjunctivae and EOM are normal.   Neck: Normal range of motion. Neck supple. No JVD present. Carotid bruit is not present. No thyromegaly present.   Cardiovascular: Normal rate, regular rhythm, normal heart sounds and intact distal pulses.  Exam reveals no gallop and no friction rub.    No murmur heard.  Pulmonary/Chest: Effort normal and breath sounds normal. She has no wheezes. She has no rhonchi. She has no rales.   Abdominal: Soft. Bowel sounds are normal.   Musculoskeletal: Normal range of motion.   Neurological: She is alert and oriented to person, place, and time. Coordination normal.   Skin: Skin is warm and dry.   Psychiatric: She has a normal mood and affect. Her behavior is normal. Judgment and thought content normal.     Diagnoses and all orders for this visit:    1. Essential hypertension  Stable, cont pres tx plan.     2. IFG (impaired fasting glucose)  Stable, cont pres tx plan.      3. Hyperlipidemia, unspecified hyperlipidemia type  Stable, cont pres tx plan.     4. Gastroesophageal reflux disease, esophagitis presence not specified  Stable, cont pres tx plan.       Follow-up and Dispositions    ?? Return in about 3 months (around 11/30/2018) for elevated fasting blood sugar, high blood pressure, high cholesterol, gerd.

## 2018-09-01 NOTE — Progress Notes (Signed)
Kristen Ramsey presents today for   Chief Complaint   Patient presents with   ??? Hypertension     follow up   ??? Blood sugar problem   ??? Cholesterol Problem   ??? GERD   ??? Labs     completed 05/27/18       Kristen Ramsey preferred language for health care discussion is english/other.    Is someone accompanying this pt? no    Is the patient using any DME equipment during [place]? no    Depression Screening:  3 most recent PHQ Screens 09/01/2018   PHQ Not Done -   Little interest or pleasure in doing things Not at all   Feeling down, depressed, irritable, or hopeless Not at all   Total Score PHQ 2 0   Trouble falling or staying asleep, or sleeping too much -   Feeling tired or having little energy -   Poor appetite, weight loss, or overeating -   Feeling bad about yourself - or that you are a failure or have let yourself or your family down -   Trouble concentrating on things such as school, work, reading, or watching TV -   Moving or speaking so slowly that other people could have noticed; or the opposite being so fidgety that others notice -   Thoughts of being better off dead, or hurting yourself in some way -   PHQ 9 Score -   How difficult have these problems made it for you to do your work, take care of your home and get along with others -       Learning Assessment:  Learning Assessment 09/01/2018   PRIMARY LEARNER Patient   HIGHEST LEVEL OF EDUCATION - PRIMARY LEARNER  SOME COLLEGE   BARRIERS PRIMARY LEARNER NONE   CO-LEARNER CAREGIVER No   PRIMARY LANGUAGE ENGLISH   LEARNER PREFERENCE PRIMARY LISTENING   ANSWERED BY self   RELATIONSHIP SELF       Abuse Screening:  Abuse Screening Questionnaire 09/01/2018   Do you ever feel afraid of your partner? N   Are you in a relationship with someone who physically or mentally threatens you? N   Is it safe for you to go home? Y       Generalized Anxiety  No flowsheet data found.      Health Maintenance Due   Topic Date Due   ??? Pneumococcal 0-64 years (1 of 1 - PPSV23) [birth year]09/1962    ??? Shingrix Vaccine Age 56> (1 of 2) 06/09/2006   .      Health Maintenance reviewed and discussed and ordered per Provider.    Kristen Ramsey is updated on all HM    Coordination of Care:  1. Have you been to the ER, urgent care clinic since your last visit?  Hospitalized since your last visit? no    2. Have you seen or consulted any other health care providers outside of the [facility] since your last visit?  Include any pap smears or colon screening. no      Advance Directive:  1. Do you have an advance directive in place? Patient Reply:no    2. If not, would you like material regarding how to put one in place? Patient Reply: no

## 2018-10-21 NOTE — Progress Notes (Signed)
NN health screening:    Negative mammogram, colonoscopy and cervical cancer screening is updated in HM. Colonoscopy was completed in 2019 with surveillance recommendation of 5 yrs. Closed this episode of care.

## 2018-12-01 ENCOUNTER — Encounter: Attending: Family Medicine | Primary: Family Medicine

## 2019-01-05 ENCOUNTER — Telehealth
Admit: 2019-01-05 | Discharge: 2019-01-05 | Payer: PRIVATE HEALTH INSURANCE | Attending: Family Medicine | Primary: Family Medicine

## 2019-01-05 DIAGNOSIS — R7301 Impaired fasting glucose: Secondary | ICD-10-CM

## 2019-01-05 NOTE — Progress Notes (Signed)
Kristen Ramsey presents today for   Chief Complaint   Patient presents with   ??? Blood sugar problem     Follow up    ??? Hypertension     Follow up    ??? Cholesterol Problem     Follow up    ??? GERD      Follow up        Is someone accompanying this pt? No     Is the patient using any DME equipment during [facility]? No    Depression Screening:  3 most recent PHQ Screens 09/01/2018   PHQ Not Done -   Little interest or pleasure in doing things Not at all   Feeling down, depressed, irritable, or hopeless Not at all   Total Score PHQ 2 0   Trouble falling or staying asleep, or sleeping too much -   Feeling tired or having little energy -   Poor appetite, weight loss, or overeating -   Feeling bad about yourself - or that you are a failure or have let yourself or your family down -   Trouble concentrating on things such as school, work, reading, or watching TV -   Moving or speaking so slowly that other people could have noticed; or the opposite being so fidgety that others notice -   Thoughts of being better off dead, or hurting yourself in some way -   PHQ 9 Score -   How difficult have these problems made it for you to do your work, take care of your home and get along with others -       Learning Assessment:  Learning Assessment 09/01/2018   PRIMARY LEARNER Patient   HIGHEST LEVEL OF EDUCATION - PRIMARY LEARNER  SOME COLLEGE   BARRIERS PRIMARY LEARNER NONE   CO-LEARNER CAREGIVER No   PRIMARY LANGUAGE ENGLISH   LEARNER PREFERENCE PRIMARY LISTENING   ANSWERED BY self   RELATIONSHIP SELF       Abuse Screening:  Abuse Screening Questionnaire 09/01/2018   Do you ever feel afraid of your partner? N   Are you in a relationship with someone who physically or mentally threatens you? N   Is it safe for you to go home? Y         Health Maintenance Due   Topic Date Due   ??? Pneumococcal 0-64 years (1 of 1 - PPSV23) 06/09/1962   ??? Shingrix Vaccine Age 39> (1 of 2) 06/09/2006   .       Health Maintenance reviewed and discussed and ordered per Provider.      Coordination of Care  1. Have you been to the ER, urgent care clinic since your last visit?  Hospitalized since your last visit? 09/2018, 11/2018 [facility] related to [place] concerns.     2. Have you seen or consulted any other health care providers outside of the [facility] since your last visit?  Include any pap smears or colon screening. No       Advance Directive:  1. Do you have an advance directive in place? Patient Reply:No    2. If not, would you like material regarding how to put one in place? Patient Reply: No

## 2019-01-05 NOTE — Progress Notes (Signed)
Kristen Ramsey is a 63 y.o. female who was seen by synchronous (real-time) audio-video technology on 01/05/2019.      Consent: Michal Strzelecki, who was seen by synchronous (real-time) audio-video technology, and/or her healthcare decision maker, is aware that this patient-initiated, Telehealth encounter on 01/05/2019 is a billable service, with coverage as determined by her insurance carrier. She is aware that she may receive a bill and has provided verbal consent to proceed: Yes.       Assessment & Plan:   Diagnoses and all orders for this visit:    1. IFG (impaired fasting glucose)  -     METABOLIC PANEL, COMPREHENSIVE; Future  -     LIPID PANEL; Future  -     HEMOGLOBIN A1C WITH EAG; Future    2. Essential hypertension  -     METABOLIC PANEL, COMPREHENSIVE; Future  -     LIPID PANEL; Future  Stable, cont pres tx plan.     3. Hyperlipidemia, unspecified hyperlipidemia type  -     METABOLIC PANEL, COMPREHENSIVE; Future  -     LIPID PANEL; Future    4. Gastroesophageal reflux disease, esophagitis presence not specified  5. Dysphagia, unspecified type  Pt to call gi for appt.      The complexity of medical decision making for this visit is moderate   Follow-up and Dispositions    ?? Return in about 3 months (around 04/07/2019) for elevated fasting blood sugar, high blood pressure, high cholesterol, gerd, lab review.           712  Subjective:   Kristen Ramsey is a 63 y.o. female who was seen for Blood sugar problem (Follow up ); Hypertension (Follow up ); Cholesterol Problem (Follow up ); and GERD ( Follow up )    Patient contacted via [email or web address].      Pt has had 2 ER visits since her last ofc visit.  She states that she is just now starting to eat again.  She has trouble swallowing more solid food boluses.  She has not yet made an appt with gi.      Pt does check home bp readings.  It has been normotensive per pt.      Prior to Admission medications    Medication Sig Start Date End Date Taking? Authorizing Provider    albuterol (PROVENTIL HFA, VENTOLIN HFA, PROAIR HFA) 90 mcg/actuation inhaler Take 2 Puffs by inhalation every four (4) hours as needed for Shortness of Breath. 05/27/18  Yes Albin Felling, MD   losartan (COZAAR) 50 mg tablet Take 1 Tab by mouth daily. 05/26/18  Yes Patte Winkel, Duke Salvia, MD   alclometasone (ACLOVATE) 0.05 % topical cream APPLY 1 APPLICATION OF CREAM TOPICALLY TWICE DAILY TO AFFECTED AREA OF SKIN FOR SEVERE DAYS 01/18/18  Yes Provider, Historical   hydrOXYzine HCl (ATARAX) 25 mg tablet Take 25 mg by mouth. 07/31/17  Yes Provider, Historical   augmented betamethasone dipropionate (DIPROLENE-AF) 0.05 % topical cream Apply  to affected area. 07/31/17  Yes Provider, Historical   gabapentin (NEURONTIN) 300 mg capsule Take 300 mg by mouth three (3) times daily.   Yes Provider, Historical   omeprazole (PRILOSEC) 20 mg capsule  07/31/18   Provider, Historical   fluticasone propionate (FLONASE) 50 mcg/actuation nasal spray 1 Spray. 08/09/17   Provider, Historical   esomeprazole (NEXIUM) 40 mg capsule Take 1 Cap by mouth daily. 02/23/18   Albin Felling, MD     Allergies   Allergen Reactions   ???  Codeine Seizures     Other reaction(s): Other (See Comments)  seizures  Other reaction(s): neurological reaction   ??? Tomato Hives   ??? Lisinopril Cough       There is no problem list on file for this patient.    Current Outpatient Medications   Medication Sig Dispense Refill   ??? albuterol (PROVENTIL HFA, VENTOLIN HFA, PROAIR HFA) 90 mcg/actuation inhaler Take 2 Puffs by inhalation every four (4) hours as needed for Shortness of Breath. 1 Inhaler 3   ??? losartan (COZAAR) 50 mg tablet Take 1 Tab by mouth daily. 90 Tab 3   ??? alclometasone (ACLOVATE) 0.05 % topical cream APPLY 1 APPLICATION OF CREAM TOPICALLY TWICE DAILY TO AFFECTED AREA OF SKIN FOR SEVERE DAYS  1   ??? hydrOXYzine HCl (ATARAX) 25 mg tablet Take 25 mg by mouth.     ??? augmented betamethasone dipropionate (DIPROLENE-AF) 0.05 % topical cream Apply  to affected area.      ??? gabapentin (NEURONTIN) 300 mg capsule Take 300 mg by mouth three (3) times daily.     ??? omeprazole (PRILOSEC) 20 mg capsule      ??? fluticasone propionate (FLONASE) 50 mcg/actuation nasal spray 1 Spray.     ??? esomeprazole (NEXIUM) 40 mg capsule Take 1 Cap by mouth daily. 30 Cap 5     Allergies   Allergen Reactions   ??? Codeine Seizures     Other reaction(s): Other (See Comments)  seizures  Other reaction(s): neurological reaction   ??? Tomato Hives   ??? Lisinopril Cough     Past Medical History:   Diagnosis Date   ??? Arthritis    ??? Chronic obstructive pulmonary disease ([place])    ??? Depression    ??? Elevated glucose    ??? Glaucoma    ??? Gout    ??? HTN (hypertension)    ??? Joint pain      Past Surgical History:   Procedure Laterality Date   ??? COLONOSCOPY N/A 09/03/2017    COLONOSCOPY / polypectomy performed by Jannet Mantis, MD at [facility]   ??? HX ORTHOPAEDIC      Left hand repair due to work injury   ??? HX TUBAL LIGATION       Family History   Problem Relation Age of Onset   ??? Stroke Mother    ??? Alzheimer Mother    ??? Cancer Father    ??? Diabetes Brother    ??? Dementia Brother      Social History     Tobacco Use   ??? Smoking status: Current Every Day Smoker   ??? Smokeless tobacco: Never Used   Substance Use Topics   ??? Alcohol use: Yes     Alcohol/week: 2.0 standard drinks     Types: 2 Cans of beer per week       Review of Systems   Constitutional: Negative.    HENT: Negative.    Respiratory: Negative.    Cardiovascular: Negative.    Gastrointestinal: Positive for heartburn and vomiting.        Trouble swallowing   All other systems reviewed and are negative.        Objective:   There were no vitals taken for this visit.   General: alert, cooperative, no distress   Mental  status: normal mood, behavior, speech, dress, motor activity, and thought processes, able to follow commands   HENT: NCAT   Neck: no visualized mass   Resp: no respiratory distress  Neuro: no gross deficits    Skin: no discoloration or lesions of concern on visible areas   Psychiatric: normal affect, consistent with stated mood, no evidence of hallucinations     Additional exam findings: none    We discussed the expected course, resolution and complications of the diagnosis(es) in detail.  Medication risks, benefits, costs, interactions, and alternatives were discussed as indicated.  I advised her to contact the office if her condition worsens, changes or fails to improve as anticipated. She expressed understanding with the diagnosis(es) and plan.     Nyeshia Mysliwiec is a 63 y.o. female who was evaluated by a video visit encounter for concerns as above. Patient identification was verified prior to start of the visit. A caregiver was present when appropriate. Due to this being a Scientist, physiological (During RKYHC-62 public health emergency), evaluation of the following organ systems was limited: Vitals/Constitutional/EENT/Resp/CV/GI/GU/MS/Neuro/Skin/Heme-Lymph-Imm.  Pursuant to the emergency declaration under the [place], 1135 waiver authority and the [employer] Act, this Virtual  Visit was conducted, with patient's (and/or legal guardian's) consent, to reduce the patient's risk of exposure to COVID-19 and provide necessary medical care.     Services were provided through a video synchronous discussion virtually to substitute for in-person clinic visit.  Patient and provider were located at their individual homes.      Albin Felling, MD

## 2019-01-29 ENCOUNTER — Telehealth

## 2019-01-29 NOTE — Telephone Encounter (Signed)
ok 

## 2019-02-08 NOTE — Telephone Encounter (Signed)
Kristen Felling, MD - would patient benefit from moderate or high intensity statin therapy?    I can contact patient/family to discuss any changes in therapy.     Thank you,  Valarie Cones, PharmD, RPh, [place] Clinical Pharmacist  O: [phone]  Department, toll free: [phone], option 7   ==============================================================    CLINICAL PHARMACY: STATIN THERAPY REVIEW    SUBJECTIVE:   Identified care gap per [employer]: statin use in persons with cardiovascular disease.    Last Office Visit: 01/05/2019    ASSESSMENT:    Patient has been identified as having an inpatient hospitalization for MI, CABG, PCI, or diagnosis of ischemic vascular disease in the past and not currently filling a moderate or high intensity statin.     Lab Results   Component Value Date    CHOL 198 05/27/2018    HDL 58 05/27/2018     ALT (SGPT)   Date Value Ref Range Status   05/27/2018 14 13 - 56 U/L Final     AST (SGOT)   Date Value Ref Range Status   05/27/2018 11 10 - 38 U/L Final     The ASCVD Risk score Mikey Bussing DC Jr., et al., 2013) failed to calculate for the following reasons:    ASCVD risk score not calculated    PLAN:  Would recommend prescribing a moderate or high intensity statin, such as rosuvastatin 10 mg    Valarie Cones, PharmD, RPh, [place] Pharmacist  O: [phone]  Department, toll free: [phone], option 7

## 2019-02-11 NOTE — Telephone Encounter (Addendum)
Reached patient to review. Discussed starting statin medication and all the benefits with Kristen Ramsey.  Carilyn very adamantly states she will NOT start a new medication.  She states she does not like the medications she currently takes, how they all have side effects, etc.  Again reviewed the benefits of a statin with patient. She continues to decline the thought of any new medications.  Informed patient if she changes her mind to please contact the office.    Future Appointments   Date Time Provider [place]   04/07/2019 11:30 AM Albin Felling, MD [facility] None      No further outreach planned by PharmD at this time.    Valarie Cones, PharmD, RPh, [place] Clinical Pharmacist  O: [phone]  Department, toll free: [phone], option 7     CLINICAL PHARMACY CONSULT: MED RECONCILIATION/REVIEW ADDENDUM    For Pharmacy Admin Tracking Only  PHSO: PHSO Patient?: Yes  Total # of Interventions Recommended: Count: 1  - New Order #: 1 New Medication Order Reason(s): Needs Additional Medication Therapy  - Maintenance Safety Lab Monitoring #: 1  Recommended intervention potential cost savings: 0  Total Interventions Accepted: 1  Time Spent (min): 15

## 2019-02-19 ENCOUNTER — Inpatient Hospital Stay: Payer: MEDICARE

## 2019-02-19 MED ORDER — LACTATED RINGERS IV
INTRAVENOUS | Status: DC
Start: 2019-02-19 — End: 2019-02-19

## 2019-02-19 MED ORDER — LIDOCAINE (PF) 20 MG/ML (2 %) IJ SOLN
20 mg/mL (2 %) | INTRAMUSCULAR | Status: DC | PRN
Start: 2019-02-19 — End: 2019-02-19
  Administered 2019-02-19: 14:00:00 via INTRAVENOUS

## 2019-02-19 MED ORDER — LACTATED RINGERS IV
INTRAVENOUS | Status: DC
Start: 2019-02-19 — End: 2019-02-19
  Administered 2019-02-19: 14:00:00 via INTRAVENOUS

## 2019-02-19 MED ORDER — PROPOFOL INFUSION
10 mg/mL | INTRAVENOUS | Status: DC | PRN
Start: 2019-02-19 — End: 2019-02-19
  Administered 2019-02-19: 14:00:00 via INTRAVENOUS

## 2019-02-19 MED ORDER — LIDOCAINE (PF) 10 MG/ML (1 %) IJ SOLN
10 mg/mL (1 %) | INTRAMUSCULAR | Status: DC | PRN
Start: 2019-02-19 — End: 2019-02-19

## 2019-02-19 MED ORDER — FENTANYL CITRATE (PF) 50 MCG/ML IJ SOLN
50 mcg/mL | INTRAMUSCULAR | Status: DC | PRN
Start: 2019-02-19 — End: 2019-02-19

## 2019-02-19 MED ORDER — ONDANSETRON (PF) 4 MG/2 ML INJECTION
4 mg/2 mL | Freq: Once | INTRAMUSCULAR | Status: DC
Start: 2019-02-19 — End: 2019-02-19

## 2019-02-19 MED ORDER — SODIUM CHLORIDE 0.9 % INJECTION
202 mg/2 mL | Freq: Once | INTRAMUSCULAR | Status: AC
Start: 2019-02-19 — End: 2019-02-19
  Administered 2019-02-19: 14:00:00 via INTRAVENOUS

## 2019-02-19 MED ORDER — PROPOFOL 10 MG/ML IV EMUL
10 mg/mL | INTRAVENOUS | Status: DC | PRN
Start: 2019-02-19 — End: 2019-02-19
  Administered 2019-02-19: 14:00:00 via INTRAVENOUS

## 2019-02-19 MED FILL — FAMOTIDINE (PF) 20 MG/2 ML IV: 20 mg/2 mL | INTRAVENOUS | Qty: 2

## 2019-02-19 MED FILL — LACTATED RINGERS IV: INTRAVENOUS | Qty: 1000

## 2019-02-19 NOTE — H&P (Signed)
History and Physical reviewed; I have examined the patient and there are no pertinent changes.    Lucretia Field, MD, MD   9:59 AM 02/19/2019  [facility] [place]  [email or web address]

## 2019-02-19 NOTE — Procedures (Signed)
[facility] [place] [address]      Brief Procedure Note    Brelee Renk  [birth year]  [id number]    Date of Procedure: 02/19/2019    Preoperative diagnosis: Dysphagia:  787.20 - R13.0  Unintentional weight loss:  783.21 - R63.4  Abdominal pain, epigastric:  789.06 - R10.13  Nausea and vomiting:  787.01 - R11.2  Anemia:  285.9 - D64.9    Postoperative diagnosis:  GE junction bx r/o cancer    Type of Anesthesia: MAC (monitered anesthesia care)    Description of Findings: same as post op dx    Procedure: Procedure(s):  UPPER ENDOSCOPY / dilation    Operator:  Dr. Lucretia Field, MD    Assistant(s): _0 @    Type of Anesthesia:MAC     TJQ:ZESP, no implants.    Specimens:   ID Type Source Tests Collected by Time Destination   1 : GE junction bx Preservative   Lucretia Field, MD 02/19/2019 1007 Pathology       Findings: See printed and scanned procedure note    Complications: None    Dr. Lucretia Field, MD  02/19/2019  10:16 AM

## 2019-02-19 NOTE — Anesthesia Pre-Procedure Evaluation (Signed)
Relevant Problems   No relevant active problems       Anesthetic History   No history of anesthetic complications            Review of Systems / Medical History  Patient summary reviewed and pertinent labs reviewed    Pulmonary    COPD: mild               Neuro/Psych         Psychiatric history  Pertinent negatives: TIA: Depression.   Cardiovascular    Hypertension: well controlled              Exercise tolerance: >4 METS     GI/Hepatic/Renal                Endo/Other        Arthritis     Other Findings              Physical Exam    Airway  Mallampati: II  TM Distance: 4 - 6 cm  Neck ROM: normal range of motion   Mouth opening: Normal     Cardiovascular  Regular rate and rhythm,  S1 and S2 normal,  no murmur, click, rub, or gallop             Dental  No notable dental hx       Pulmonary  Breath sounds clear to auscultation               Abdominal  GI exam deferred       Other Findings            Anesthetic Plan    ASA: 3  Anesthesia type: MAC          Induction: Intravenous  Anesthetic plan and risks discussed with: Patient

## 2019-02-19 NOTE — Anesthesia Post-Procedure Evaluation (Signed)
Procedure(s):  UPPER ENDOSCOPY / dilation.    MAC    Anesthesia Post Evaluation      Multimodal analgesia: multimodal analgesia used between 6 hours prior to anesthesia start to PACU discharge  Patient location during evaluation: bedside  Patient participation: complete - patient participated  Level of consciousness: awake  Pain management: adequate  Airway patency: patent  Anesthetic complications: no  Cardiovascular status: stable  Respiratory status: acceptable  Hydration status: acceptable  Post anesthesia nausea and vomiting:  controlled      INITIAL Post-op Vital signs:   Vitals Value Taken Time   BP 112/61 02/19/2019 10:34 AM   Temp 36.8 ??C (98.2 ??F) 02/19/2019 10:15 AM   Pulse 79 02/19/2019 10:35 AM   Resp 23 02/19/2019 10:35 AM   SpO2 100 % 02/19/2019 10:35 AM   Vitals shown include unvalidated device data.

## 2019-03-02 ENCOUNTER — Encounter

## 2019-03-11 ENCOUNTER — Inpatient Hospital Stay: Admit: 2019-03-11 | Payer: MEDICARE | Attending: Hematology & Oncology | Primary: Family Medicine

## 2019-03-11 DIAGNOSIS — C160 Malignant neoplasm of cardia: Secondary | ICD-10-CM

## 2019-03-11 MED ORDER — F-18 FLUORODEOXYGLUCOSE
Freq: Once | Status: AC
Start: 2019-03-11 — End: 2019-03-11
  Administered 2019-03-11: 15:00:00 via INTRAVENOUS

## 2019-04-07 ENCOUNTER — Telehealth: Admit: 2019-04-07 | Payer: PRIVATE HEALTH INSURANCE | Attending: Family Medicine | Primary: Family Medicine

## 2019-04-07 DIAGNOSIS — I10 Essential (primary) hypertension: Secondary | ICD-10-CM

## 2019-04-07 NOTE — Progress Notes (Signed)
Kristen Ramsey is a 63 y.o. female who was seen by synchronous (real-time) audio-video technology on 04/07/2019 for Hypertension (3 month follow up ); Cholesterol Problem (3 month follow up ); GERD (3  month follow up ); Labs; and Blood sugar problem        Assessment & Plan:   Diagnoses and all orders for this visit:    1. Essential hypertension  Stable, cont pres tx plan.     2. IFG (impaired fasting glucose)  Stable, cont pres tx plan.     3. Iron deficiency anemia, unspecified iron deficiency anemia type  Care as per heme/onc    4. Adenocarcinoma of gastroesophageal junction (HCC)  Pt seems unlikely to decided to proceed with treatment.  She will f/u with med onc soon.    The complexity of medical decision making for this visit is moderate   Follow-up and Dispositions    ?? Return in about 3 months (around 07/07/2019) for high blood pressure, anemia, high cholesterol.           712  Subjective:   Patient contacted via [email or web address].      Since her last appt, pt has been dx with ge junction adenocarcinoma.  Pt has declined chemotherapy and surgery.  She did go have an appt with Dr. Renita Papa (rad onc).  Pt plans to talk to her family before she decides how she will proceed.  She has talked to her children, other than the son who is incarcerated.  Pt is aware that her cancer will kill her if she does not proceed with treatment.  She will f/u with Dr. Nash Mantis later this month.  In the meantime, she is on an herbal regimen that she feels is helping.  She is taking her iron supplement and bp med.  She has gained a pound in the last few days; her appetite is good now.  However, she is restricted from certain foods with the herbal regimen that her family has started her on.  She tried to take a walk recently but had to turn back due to fatigue.      Pt's bp was low at her med onc appt but was 137/84 at home yesterday.      Prior to Admission medications    Medication Sig Start Date End Date Taking? Authorizing Provider    ferrous gluconate (FERGON) 240 mg (27 mg iron) tablet  03/16/19  Yes Provider, Historical   HYDROcodone-acetaminophen (NORCO) 5-325 mg per tablet TK 1 T PO  Q 4 H PRN P 03/16/19  Yes Provider, Historical   aspirin 81 mg chewable tablet Take 81 mg by mouth daily.   Yes Provider, Historical   albuterol (PROVENTIL HFA, VENTOLIN HFA, PROAIR HFA) 90 mcg/actuation inhaler Take 2 Puffs by inhalation every four (4) hours as needed for Shortness of Breath. 05/27/18  Yes Albin Felling, MD   losartan (COZAAR) 50 mg tablet Take 1 Tab by mouth daily. 05/26/18  Yes Albin Felling, MD   omeprazole (PRILOSEC) 40 mg capsule omeprazole 40 mg capsule,delayed release   TAKE 1 CAPSULE BY MOUTH EVERY DAY    Provider, Historical   hydrOXYzine HCl (ATARAX) 25 mg tablet Take 25 mg by mouth. 07/31/17   Provider, Historical   esomeprazole (NEXIUM) 40 mg capsule Take 1 Cap by mouth daily. 02/23/18   Albin Felling, MD   gabapentin (NEURONTIN) 300 mg capsule Take 300 mg by mouth three (3) times daily.    Provider, Historical  There is no problem list on file for this patient.    Current Outpatient Medications   Medication Sig Dispense Refill   ??? ferrous gluconate (FERGON) 240 mg (27 mg iron) tablet      ??? HYDROcodone-acetaminophen (NORCO) 5-325 mg per tablet TK 1 T PO  Q 4 H PRN P     ??? aspirin 81 mg chewable tablet Take 81 mg by mouth daily.     ??? albuterol (PROVENTIL HFA, VENTOLIN HFA, PROAIR HFA) 90 mcg/actuation inhaler Take 2 Puffs by inhalation every four (4) hours as needed for Shortness of Breath. 1 Inhaler 3   ??? losartan (COZAAR) 50 mg tablet Take 1 Tab by mouth daily. 90 Tab 3   ??? omeprazole (PRILOSEC) 40 mg capsule omeprazole 40 mg capsule,delayed release   TAKE 1 CAPSULE BY MOUTH EVERY DAY     ??? hydrOXYzine HCl (ATARAX) 25 mg tablet Take 25 mg by mouth.     ??? esomeprazole (NEXIUM) 40 mg capsule Take 1 Cap by mouth daily. 30 Cap 5   ??? gabapentin (NEURONTIN) 300 mg capsule Take 300 mg by mouth three (3) times daily.       Allergies    Allergen Reactions   ??? Codeine Seizures     Other reaction(s): Other (See Comments)  seizures  Other reaction(s): neurological reaction   ??? Tomato Hives   ??? Lisinopril Cough     Past Medical History:   Diagnosis Date   ??? Arthritis    ??? CAD (coronary artery disease) 2005    MI   ??? Chronic obstructive pulmonary disease ([place])    ??? Depression    ??? Elevated glucose    ??? Glaucoma    ??? Gout    ??? HTN (hypertension)    ??? Joint pain      Past Surgical History:   Procedure Laterality Date   ??? COLONOSCOPY N/A 09/03/2017    COLONOSCOPY / polypectomy performed by [facility]et [facility]antis, [facility] at [facility]   ??? HX ORTHOPAEDIC      Left hand repair due to work injury   ??? HX TUBAL LIGATION       Family History   Problem Relation Age of Onset   ??? Stroke Mother    ??? Alzheimer Mother    ??? Cancer Father    ??? Diabetes Brother    ??? Dementia Brother      Social History     Tobacco Use   ??? Smoking status: Former Smoker     Last attempt to quit: 02/05/2019     Years since quitting: 0.1   ??? Smokeless tobacco: Never Used   Substance Use Topics   ??? Alcohol use: Yes     Alcohol/week: 2.0 standard drinks     Types: 2 Glasses of wine per week       Review of Systems   Constitutional: Negative.    HENT: Negative.    Respiratory: Negative.    Cardiovascular: Negative.    Gastrointestinal: Positive for abdominal pain.   All other systems reviewed and are negative.      Objective:   No flowsheet data found.   General: alert, cooperative, no distress   Mental  status: normal mood, behavior, speech, dress, motor activity, and thought processes, able to follow commands   HENT: NCAT   Neck: no visualized mass   Resp: no respiratory distress   Neuro: no gross deficits   Skin: no discoloration or lesions of concern on visible areas  Psychiatric: normal affect, consistent with stated mood, no evidence of hallucinations     Additional exam findings: none      We discussed the expected course, resolution and complications of the  diagnosis(es) in detail.  Medication risks, benefits, costs, interactions, and alternatives were discussed as indicated.  I advised her to contact the office if her condition worsens, changes or fails to improve as anticipated. She expressed understanding with the diagnosis(es) and plan.       Kristen Ramsey, who was evaluated through a patient-initiated, synchronous (real-time) audio-video encounter, and/or her healthcare decision maker, is aware that it is a billable service, with coverage as determined by her insurance carrier. She provided verbal consent to proceed: n/a- consent obtained within past 12 months, and patient identification was verified. It was conducted pursuant to the emergency declaration under the [place], [place] waiver authority and the [employer] Act. A caregiver was present when appropriate. Ability to conduct physical exam was limited. I was in the office. The patient was at home.      Albin Felling, MD

## 2019-04-07 NOTE — Progress Notes (Addendum)
Kristen Ramsey presents today for   Chief Complaint   Patient presents with   ??? Hypertension     3 month follow up    ??? Cholesterol Problem     3 month follow up    ??? GERD     3  month follow up    ??? Labs   ??? Blood sugar problem       Is someone accompanying this pt? No    Is the patient using any DME equipment during OV? No    Depression Screening:  3 most recent PHQ Screens 09/01/2018   PHQ Not Done -   Little interest or pleasure in doing things Not at all   Feeling down, depressed, irritable, or hopeless Not at all   Total Score PHQ 2 0   Trouble falling or staying asleep, or sleeping too much -   Feeling tired or having little energy -   Poor appetite, weight loss, or overeating -   Feeling bad about yourself - or that you are a failure or have let yourself or your family down -   Trouble concentrating on things such as school, work, reading, or watching TV -   Moving or speaking so slowly that other people could have noticed; or the opposite being so fidgety that others notice -   Thoughts of being better off dead, or hurting yourself in some way -   PHQ 9 Score -   How difficult have these problems made it for you to do your work, take care of your home and get along with others -       Learning Assessment:  Learning Assessment 09/01/2018   PRIMARY LEARNER Patient   HIGHEST LEVEL OF EDUCATION - PRIMARY LEARNER  SOME COLLEGE   BARRIERS PRIMARY LEARNER NONE   CO-LEARNER CAREGIVER No   PRIMARY LANGUAGE ENGLISH   LEARNER PREFERENCE PRIMARY LISTENING   ANSWERED BY self   RELATIONSHIP SELF       Abuse Screening:  Abuse Screening Questionnaire 04/07/2019   Do you ever feel afraid of your partner? N   Are you in a relationship with someone who physically or mentally threatens you? N   Is it safe for you to go home? Y         Health Maintenance Due   Topic Date Due   ??? Shingrix Vaccine Age 68> (1 of 2) 06/09/2006   ??? Flu Vaccine (1) 03/23/2019   ??? Medicare Yearly Exam  03/11/2019   .       Health Maintenance reviewed and discussed and ordered per Provider.  Coordination of Care  1. Have you been to the ER, urgent care clinic since your last visit?  Hospitalized since your last visit? No    2. Have you seen or consulted any other health care providers outside of the [place] since your last visit?  Include any pap smears or colon screening. GI 02/2019, Oncology(Dr. Chrissie Noa ) last visit 2 weeks ago.       Advance Directive:  1. Do you have an advance directive in place? Patient Reply:No    2. If not, would you like material regarding how to put one in place? Patient Reply: No

## 2019-05-28 ENCOUNTER — Telehealth: Admit: 2019-05-28 | Discharge: 2019-05-28 | Payer: MEDICARE | Attending: Family Medicine | Primary: Family Medicine

## 2019-05-28 DIAGNOSIS — Z0000 Encounter for general adult medical examination without abnormal findings: Secondary | ICD-10-CM

## 2019-05-28 NOTE — Progress Notes (Signed)
Advance Care Planning     Advance Care Planning (ACP) Physician/NP/PA Conversation      Date of Conversation: 05/28/2019  Conducted with: Patient with Decision Making Capacity    Healthcare Decision Maker:     Click here to complete [place] including selection of the [place] Relationship (ie "Primary")  Today we documented Decision Maker(s) consistent with Legal Next of Kin hierarchy.    Care Preferences:    Hospitalization: "If your health worsens and it becomes clear that your chance of recovery is unlikely, what would be your preference regarding hospitalization?"  The patient is unsure.    Ventilation: "If you were unable to breathe on your own and your chance of recovery was unlikely, what would be your preference about the use of a ventilator (breathing machine) if it was available to you?"   The patient would desire the use of a ventilator.    Resuscitation: "In the event your heart stopped as a result of an underlying serious health condition, would you want attempts to be made to restart your heart, or would you prefer a natural death?"   Yes, attempt to resuscitate.    Additional topics discussed: ventilation preferences, hospitalization preferences and resuscitation preferences    Conversation Outcomes / Follow-Up Plan:   ACP in process - information provided, considering goals and options  Reviewed DNR/DNI and patient elects Full Code (Attempt Resuscitation)     Length of Voluntary ACP Conversation in minutes:  16 minutes    Albin Felling, MD

## 2019-05-28 NOTE — Progress Notes (Signed)
Kristen Ramsey presents today for   Chief Complaint   Patient presents with   ??? Annual Wellness Visit     medicare wellness visit       Kristen Ramsey preferred language for health care discussion is english/other.    Is someone accompanying this pt? No     Is the patient using any DME equipment during OV? No    Depression Screening:  3 most recent PHQ Screens 09/01/2018   PHQ Not Done -   Little interest or pleasure in doing things Not at all   Feeling down, depressed, irritable, or hopeless Not at all   Total Score PHQ 2 0   Trouble falling or staying asleep, or sleeping too much -   Feeling tired or having little energy -   Poor appetite, weight loss, or overeating -   Feeling bad about yourself - or that you are a failure or have let yourself or your family down -   Trouble concentrating on things such as school, work, reading, or watching TV -   Moving or speaking so slowly that other people could have noticed; or the opposite being so fidgety that others notice -   Thoughts of being better off dead, or hurting yourself in some way -   PHQ 9 Score -   How difficult have these problems made it for you to do your work, take care of your home and get along with others -       Learning Assessment:  Learning Assessment 09/01/2018   PRIMARY LEARNER Patient   HIGHEST LEVEL OF EDUCATION - PRIMARY LEARNER  SOME COLLEGE   BARRIERS PRIMARY LEARNER NONE   CO-LEARNER CAREGIVER No   PRIMARY LANGUAGE ENGLISH   LEARNER PREFERENCE PRIMARY LISTENING   ANSWERED BY self   RELATIONSHIP SELF       Abuse Screening:  Abuse Screening Questionnaire 04/07/2019   Do you ever feel afraid of your partner? N   Are you in a relationship with someone who physically or mentally threatens you? N   Is it safe for you to go home? Y       Fall Risk  Fall Risk Assessment, last 12 mths 09/01/2018   Able to walk? Yes   Fall in past 12 months? No   Fall with injury? -   Number of falls in past 12 months -   Fall Risk Score -         Coordination of Care:   1. Have you been to the ER, urgent care clinic since your last visit?  Hospitalized since your last visit?No    2. Have you seen or consulted any other health care providers outside of the [facility] since your last visit?  Include any pap smears or colon screening. No      Advance Directive:  1. Do you have an advance directive in place? Patient Reply:No    2. If not, would you like material regarding how to put one in place? Patient Reply: No

## 2019-05-28 NOTE — Patient Instructions (Signed)
Medicare Wellness Visit, Female     The best way to live healthy is to have a lifestyle where you eat a well-balanced diet, exercise regularly, limit alcohol use, and quit all forms of tobacco/nicotine, if applicable.     Regular preventive services are another way to keep healthy. Preventive services (vaccines, screening tests, monitoring & exams) can help personalize your care plan, which helps you manage your own care. Screening tests can find health problems at the earliest stages, when they are easiest to treat.   [facility] follows the current, evidence-based guidelines published by the [place] [employer] (USPSTF) when recommending preventive services for our patients. Because we follow these guidelines, sometimes recommendations change over time as research supports it. (For example, mammograms used to be recommended annually. Even though Medicare will still pay for an annual mammogram, the newer guidelines recommend a mammogram every two years for women of average risk).  Of course, you and your doctor may decide to screen more often for some diseases, based on your risk and your co-morbidities (chronic disease you are already diagnosed with).     Preventive services for you include:  - Medicare offers their members a free annual wellness visit, which is time for you and your primary care provider to discuss and plan for your preventive service needs. Take advantage of this benefit every year!  -All adults over the age of 71 should receive the recommended pneumonia vaccines. Current USPSTF guidelines recommend a series of two vaccines for the best pneumonia protection.   -All adults should have a flu vaccine yearly and a tetanus vaccine every 10 years.   -All adults age 48 and older should receive the shingles vaccines (series of two vaccines).      -All adults age 85-70 who are overweight should have a diabetes screening test once every three years.    -All adults born between [birth year] and 1965 should be screened once for Hepatitis C.  -Other screening tests and preventive services for persons with diabetes include: an eye exam to screen for diabetic retinopathy, a kidney function test, a foot exam, and stricter control over your cholesterol.   -Cardiovascular screening for adults with routine risk involves an electrocardiogram (ECG) at intervals determined by your doctor.   -Colorectal cancer screenings should be done for adults age 54-75 with no increased risk factors for colorectal cancer.  There are a number of acceptable methods of screening for this type of cancer. Each test has its own benefits and drawbacks. Discuss with your doctor what is most appropriate for you during your annual wellness visit. The different tests include: colonoscopy (considered the best screening method), a fecal occult blood test, a fecal DNA test, and sigmoidoscopy.    -A bone mass density test is recommended when a woman turns 65 to screen for osteoporosis. This test is only recommended one time, as a screening. Some providers will use this same test as a disease monitoring tool if you already have osteoporosis.  -Breast cancer screenings are recommended every other year for women of normal risk, age 11-74.  -Cervical cancer screenings for women over age 63 are only recommended with certain risk factors.     Here is a list of your current Health Maintenance items (your personalized list of preventive services) with a due date:  Health Maintenance Due   Topic Date Due   ??? Shingles Vaccine (1 of 2) 06/09/2006   ??? Annual Well Visit  03/11/2019   ???  Yearly Flu Vaccine (1) 03/23/2019

## 2019-05-28 NOTE — Progress Notes (Signed)
This is the Subsequent Medicare Annual Wellness Exam, performed 12 months or more after the Initial AWV or the last Subsequent AWV    I have reviewed the patient's medical history in detail and updated the computerized patient record.     History     Patient Active Problem List   Diagnosis Code   ??? Weight gain R63.5   ??? Tobacco user Z72.0   ??? Pre-diabetes R73.03   ??? Pain of left great toe M79.675   ??? Other screening mammogram Z12.31   ??? Osteoporosis M81.0   ??? Mixed anxiety and depressive disorder F41.8   ??? Hyperlipidemia E78.5   ??? History of myocardial infarction I25.2   ??? Gout M10.9   ??? Gastroesophageal reflux disease K21.9   ??? Essential hypertension I10   ??? Diabetes mellitus ([place]) E11.9   ??? Coronary arteriosclerosis I25.10   ??? Chest pain at rest R07.9   ??? Arthritis M19.90     Past Medical History:   Diagnosis Date   ??? Arthritis    ??? CAD (coronary artery disease) 2005    MI   ??? Chronic obstructive pulmonary disease ([place])    ??? Depression    ??? Elevated glucose    ??? Glaucoma    ??? Gout    ??? HTN (hypertension)    ??? Joint pain       Past Surgical History:   Procedure Laterality Date   ??? COLONOSCOPY N/A 09/03/2017    COLONOSCOPY / polypectomy performed by Jannet Mantis, MD at [facility]   ??? HX ORTHOPAEDIC      Left hand repair due to work injury   ??? HX TUBAL LIGATION       Current Outpatient Medications   Medication Sig Dispense Refill   ??? ferrous gluconate (FERGON) 240 mg (27 mg iron) tablet      ??? HYDROcodone-acetaminophen (NORCO) 5-325 mg per tablet TK 1 T PO  Q 4 H PRN P     ??? omeprazole (PRILOSEC) 40 mg capsule omeprazole 40 mg capsule,delayed release   TAKE 1 CAPSULE BY MOUTH EVERY DAY     ??? aspirin 81 mg chewable tablet Take 81 mg by mouth daily.     ??? albuterol (PROVENTIL HFA, VENTOLIN HFA, PROAIR HFA) 90 mcg/actuation inhaler Take 2 Puffs by inhalation every four (4) hours as needed for Shortness of Breath. 1 Inhaler 3   ??? losartan (COZAAR) 50 mg tablet Take 1 Tab by mouth daily. 90 Tab 3    ??? esomeprazole (NEXIUM) 40 mg capsule Take 1 Cap by mouth daily. 30 Cap 5   ??? hydrOXYzine HCl (ATARAX) 25 mg tablet Take 25 mg by mouth.     ??? gabapentin (NEURONTIN) 300 mg capsule Take 300 mg by mouth three (3) times daily.       Allergies   Allergen Reactions   ??? Codeine Seizures     Other reaction(s): Other (See Comments)  seizures  Other reaction(s): neurological reaction   ??? Tomato Hives   ??? Lisinopril Cough       Family History   Problem Relation Age of Onset   ??? Stroke Mother    ??? Alzheimer Mother    ??? Cancer Father    ??? Diabetes Brother    ??? Dementia Brother      Social History     Tobacco Use   ??? Smoking status: Former Smoker     Last attempt to quit: 02/05/2019     Years since quitting:  0.3   ??? Smokeless tobacco: Never Used   Substance Use Topics   ??? Alcohol use: Yes     Alcohol/week: 2.0 standard drinks     Types: 2 Glasses of wine per week       Depression Risk Factor Screening:     3 most recent PHQ Screens 09/01/2018   PHQ Not Done -   Little interest or pleasure in doing things Not at all   Feeling down, depressed, irritable, or hopeless Not at all   Total Score PHQ 2 0   Trouble falling or staying asleep, or sleeping too much -   Feeling tired or having little energy -   Poor appetite, weight loss, or overeating -   Feeling bad about yourself - or that you are a failure or have let yourself or your family down -   Trouble concentrating on things such as school, work, reading, or watching TV -   Moving or speaking so slowly that other people could have noticed; or the opposite being so fidgety that others notice -   Thoughts of being better off dead, or hurting yourself in some way -   PHQ 9 Score -   How difficult have these problems made it for you to do your work, take care of your home and get along with others -       Alcohol Risk Screen   Do you average more than 1 drink per night or more than 7 drinks a week:  No    On any one occasion in the past three months have you have had more than 3  drinks containing alcohol:  No        Functional Ability and Level of Safety:   Hearing: Hearing is good.     Activities of Daily Living:  The home contains: no safety equipment.  Patient does total self care     Ambulation: Patient uses a cane at times     Fall Risk:  Fall Risk Assessment, last 12 mths 09/01/2018   Able to walk? Yes   Fall in past 12 months? No   Fall with injury? -   Number of falls in past 12 months -   Fall Risk Score -     Abuse Screen:  Patient is not abused       Cognitive Screening   Has your family/caregiver stated any concerns about your memory: no        Patient Care Team   Patient Care Team:  Albin Felling, MD as PCP - General (Family Medicine)  Albin Felling, MD as PCP - [facility] Empaneled Provider    Assessment/Plan   Education and counseling provided:  Are appropriate based on today's review and evaluation  End-of-Life planning (with patient's consent)  Influenza Vaccine  shingles vaccination    Diagnoses and all orders for this visit:    1. Medicare annual wellness visit, subsequent    2. ACP (advance care planning)        Health Maintenance Due   Topic Date Due   ??? Shingrix Vaccine Age 46> (1 of 2) 06/09/2006   ??? Medicare Yearly Exam  03/11/2019   ??? Flu Vaccine (1) 03/23/2019   ??? A1C test (Diabetic or Prediabetic)  05/28/2019       Alto [place], who was evaluated through a synchronous (real-time) audio only encounter, and/or her healthcare decision maker, is aware that it is a billable service, with coverage as determined by her  insurance carrier. She provided verbal consent to proceed: n/a- consent obtained within past 12 months, and patient identification was verified. It was conducted pursuant to the emergency declaration under the [place], [place] waiver authority and the [employer] Act. A caregiver was present when appropriate. Ability to conduct physical exam was limited. I  was in the office. The patient was at home.    Albin Felling, MD

## 2019-06-12 ENCOUNTER — Emergency Department: Admit: 2019-06-13 | Payer: MEDICARE | Primary: Family Medicine

## 2019-06-12 DIAGNOSIS — G40201 Localization-related (focal) (partial) symptomatic epilepsy and epileptic syndromes with complex partial seizures, not intractable, with status epilepticus: Secondary | ICD-10-CM

## 2019-06-12 NOTE — ED Notes (Signed)
Awaiting bed placement; daughter is at bedside/plan of care explained/understood.   Pt remains drowsy/rouseable to voice.

## 2019-06-12 NOTE — ED Notes (Signed)
Report has been given to EMS transport; EMS at bedside

## 2019-06-12 NOTE — ED Notes (Signed)
Back from CT.  Pt has had no further seizure activity since her arrival in ED.

## 2019-06-12 NOTE — ED Notes (Signed)
Pt sitting up in bed calm/conversant.  Denies pain/discomfort at this time.  States seizure activity has been of acute onset and comes and goes.  Ativan given per order.

## 2019-06-12 NOTE — ED Provider Notes (Signed)
EMERGENCY DEPARTMENT HISTORY AND PHYSICAL EXAM      Date: 06/12/2019  Patient Name: Kristen Ramsey    History of Presenting Illness     Chief Complaint   Patient presents with   ??? Seizure       History (Context): Kristen Ramsey is a 63 y.o. woman with stage III gastroesophageal cancer who presents with 1 hour of acute onset, persistent, severe right, right leg, right face convulsions associated with numbness of the right side of the body which is persistent.  The patient says the convulsions come and go over the last hour.  No clear exacerbating/relieving features or other associated symptoms.  Denies sleep deprivation, nonadherence to meds, drug use.     On review of systems, the patient denies chest pain, shortness of breath, fever, chills, rashes, vision changes, falls, head trauma, headache, other numbness/weakness/tingling.    PCP: Albin Felling, MD    Current Outpatient Medications   Medication Sig Dispense Refill   ??? ferrous gluconate (FERGON) 240 mg (27 mg iron) tablet      ??? HYDROcodone-acetaminophen (NORCO) 5-325 mg per tablet TK 1 T PO  Q 4 H PRN P     ??? omeprazole (PRILOSEC) 40 mg capsule omeprazole 40 mg capsule,delayed release   TAKE 1 CAPSULE BY MOUTH EVERY DAY     ??? aspirin 81 mg chewable tablet Take 81 mg by mouth daily.     ??? albuterol (PROVENTIL HFA, VENTOLIN HFA, PROAIR HFA) 90 mcg/actuation inhaler Take 2 Puffs by inhalation every four (4) hours as needed for Shortness of Breath. 1 Inhaler 3   ??? losartan (COZAAR) 50 mg tablet Take 1 Tab by mouth daily. 90 Tab 3   ??? hydrOXYzine HCl (ATARAX) 25 mg tablet Take 25 mg by mouth.     ??? esomeprazole (NEXIUM) 40 mg capsule Take 1 Cap by mouth daily. 30 Cap 5   ??? gabapentin (NEURONTIN) 300 mg capsule Take 300 mg by mouth three (3) times daily.         Past History     Past Medical History:  Past Medical History:   Diagnosis Date   ??? Arthritis    ??? CAD (coronary artery disease) 2005    MI   ??? Chronic obstructive pulmonary disease ([place])    ??? Depression     ??? Elevated glucose    ??? Glaucoma    ??? Gout    ??? HTN (hypertension)    ??? Joint pain    ??? 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 COLONOSCOPY N/A 09/03/2017    COLONOSCOPY / polypectomy performed by Jannet Mantis, MD at [facility]   ??? HX ORTHOPAEDIC      Left hand repair due to work injury   ??? HX TUBAL LIGATION         Family History:  Family History   Problem Relation Age of Onset   ??? Stroke Mother    ??? Alzheimer Mother    ??? Cancer Father    ??? Diabetes Brother    ??? Dementia Brother        Social History:  Social History     Tobacco Use   ??? Smoking status: Former Smoker     Last attempt to quit: 02/05/2019     Years since quitting: 0.3   ??? Smokeless tobacco: Never Used   Substance  Use Topics   ??? Alcohol use: Yes     Alcohol/week: 2.0 standard drinks     Types: 2 Glasses of wine per week   ??? Drug use: No       Allergies:  Allergies   Allergen Reactions   ??? Codeine Seizures     Other reaction(s): Other (See Comments)  seizures  Other reaction(s): neurological reaction   ??? Tomato Hives   ??? Lisinopril Cough       PMH, PSH, family history, social history, allergies reviewed with the patient with significant items noted above.  Review of Systems   As per HPI, otherwise reviewed and negative.     Physical Exam     Vitals:    06/12/19 2200 06/12/19 2203 06/12/19 2204 06/12/19 2300   BP: 138/80   120/71   Pulse: 98 99 100 94   Resp: 22 20 14 21    Temp:       SpO2:  100% 100% 100%   Weight:           Gen: Actively convulsing on right side  HEENT: Normocephalic, sclera anicteric  Cardiovascular: Normal rate, regular rhythm, no murmurs, rubs, gallops.  Pulses intact and equal distally.  Pulmonary: No respiratory distress.  No stridor.  Clear lungs.   ABD: Soft, nontender, nondistended.  Neuro: Alert and oriented x3, cranial nerves II through XII intact, pupils equal round reactive to light, the right face twitching, cannot return to  exam for strength on the right side secondary to convulsions, sensation diminished on the right side, full strength and sensation throughout the left side of body, NIH stroke scale 10  Psych: Normal thought content and thought processes.  GU: No CVA tenderness  EXT: Moves all extremities well.  No cyanosis or clubbing.  Skin: Warm and well-perfused.          Diagnostic Study Results     Labs -     Recent Results (from the past 12 hour(s))   CBC WITH AUTOMATED DIFF    Collection Time: 06/12/19  9:46 PM   Result Value Ref Range    WBC 11.4 4.6 - 13.2 K/uL    RBC 4.34 4.20 - 5.30 M/uL    HGB 8.2 (L) 12.0 - 16.0 g/dL    HCT 27.3 (L) 35.0 - 45.0 %    MCV 62.9 (L) 74.0 - 97.0 FL    MCH 18.9 (L) 24.0 - 34.0 PG    MCHC 30.0 (L) 31.0 - 37.0 g/dL    RDW 16.5 (H) 11.6 - 14.5 %    PLATELET 727 (H) 135 - 420 K/uL    MPV 9.2 9.2 - 11.8 FL    NEUTROPHILS 67 40 - 73 %    LYMPHOCYTES 18 (L) 21 - 52 %    MONOCYTES 13 (H) 3 - 10 %    EOSINOPHILS 2 0 - 5 %    BASOPHILS 0 0 - 2 %    ABS. NEUTROPHILS 7.6 1.8 - 8.0 K/UL    ABS. LYMPHOCYTES 2.1 0.9 - 3.6 K/UL    ABS. MONOCYTES 1.5 (H) 0.05 - 1.2 K/UL    ABS. EOSINOPHILS 0.2 0.0 - 0.4 K/UL    ABS. BASOPHILS 0.0 0.0 - 0.1 K/UL    DF SMEAR SCANNED      PLATELET COMMENTS Increased Platelets      RBC COMMENTS ANISOCYTOSIS  1+        RBC COMMENTS MICROCYTOSIS  1+  RBC COMMENTS POLYCHROMASIA  1+       METABOLIC PANEL, COMPREHENSIVE    Collection Time: 06/12/19  9:46 PM   Result Value Ref Range    Sodium 136 136 - 145 mmol/L    Potassium 3.3 (L) 3.5 - 5.5 mmol/L    Chloride 102 100 - 111 mmol/L    CO2 28 21 - 32 mmol/L    Anion gap 6 3.0 - 18 mmol/L    Glucose 106 (H) 74 - 99 mg/dL    BUN 7 7.0 - 18 MG/DL    Creatinine 0.63 0.6 - 1.3 MG/DL    BUN/Creatinine ratio 11 (L) 12 - 20      GFR est AA >60 >60 ml/min/1.83m    GFR est non-AA >60 >60 ml/min/1.743m   Calcium 8.3 (L) 8.5 - 10.1 MG/DL    Bilirubin, total 0.2 0.2 - 1.0 MG/DL    ALT (SGPT) 9 (L) 13 - 56 U/L     AST (SGOT) 8 (L) 10 - 38 U/L    Alk. phosphatase 60 45 - 117 U/L    Protein, total 7.4 6.4 - 8.2 g/dL    Albumin 2.5 (L) 3.4 - 5.0 g/dL    Globulin 4.9 (H) 2.0 - 4.0 g/dL    A-G Ratio 0.5 (L) 0.8 - 1.7     PROTHROMBIN TIME + INR    Collection Time: 06/12/19  9:46 PM   Result Value Ref Range    Prothrombin time 13.9 11.5 - 15.2 sec    INR 1.1 0.8 - 1.2     TROPONIN I    Collection Time: 06/12/19  9:46 PM   Result Value Ref Range    Troponin-I, QT <0.02 0.0 - 0.045 NG/ML   GLUCOSE, POC    Collection Time: 06/12/19 10:11 PM   Result Value Ref Range    Glucose (POC) 100 70 - 110 mg/dL       Radiologic Studies -   CT HEAD WO CONT   Final Result   IMPRESSION:      1. Approximately 1 cm nodule at the left posterior frontal vertex concerning for   brain metastases. A moderate to agree of surrounding vasogenic edema.   2. Ill-defined area of slightly decreased attenuation in the right frontal white   matter that could be chronic small vessel ischemic changes or potentially   additional small metastasis.   -Recommend a dedicated renal MRI with and without contrast.   3. No findings of acute infarct. No acute hemorrhage.      Cervical lordotic protocol. Findings discussed with Dr. TaLovena Lerom the   emergency department at 2201 hours, 06/12/2019.              CT Results  (Last 48 hours)               06/12/19 2154  CT HEAD WO CONT Final result    Impression:  IMPRESSION:       1. Approximately 1 cm nodule at the left posterior frontal vertex concerning for   brain metastases. A moderate to agree of surrounding vasogenic edema.   2. Ill-defined area of slightly decreased attenuation in the right frontal white   matter that could be chronic small vessel ischemic changes or potentially   additional small metastasis.   -Recommend a dedicated renal MRI with and without contrast.   3. No findings of acute infarct. No acute hemorrhage.       Cervical lordotic protocol. Findings discussed  with Dr. Lovena Le from the    emergency department at 2201 hours, 06/12/2019.               Narrative:  CT Of The Head Without Contrast       CPT CODE: 50093       HISTORY: Seizure. Concern for acute stroke or brain metastases in the setting of   known malignancy.       COMPARISON: No prior brain imaging available.       TECHNIQUE:  Axial CT images of the head from the skull base to the vertex   without IV contrast. CT dose reduction was achieved through use of a   standardized protocol tailored for this examination and automatic exposure   control for dose modulation.        FINDINGS:        There is an area of abnormal low attenuation at the left posterior frontal   vertex most likely vasogenic edema. There appears to be a cortical nodule at the   superior margin of the edema on axial image 23 coronal image 18. Measures   approximately 1.2 centimeters transverse, 0.9 cm AP, 1.8 cm SI. In the setting   of known malignancy concerning for brain metastasis.       There is an additional area of ill-defined hypoattenuation in the right frontal   lobe adjacent to the right frontal horn. No significant surrounding mass effect,   but asymmetric to the left frontal white matter and could represent an   additional area of vasogenic edema.       No acute intracranial hemorrhage. No findings of large territory acute cortical   infarction. Brain volumes are normal. No hydrocephalus. No midline shift.       The bones appear normal. Sinuses are clear to level imaged.               CXR Results  (Last 48 hours)    None            Medical Decision Making   I am the first provider for this patient.    I reviewed the vital signs, available nursing notes, past medical history, past surgical history, family history and social history.    Vital Signs-Reviewed the patient's vital signs.       Records Reviewed: Personally, on initial evaluation    MDM:   Patient presents with seizure-like symptoms concerning for complex partial  status epilepticus.  Patient actively convulsing.  Patient history concerning for brain metastasis plus minus bleeding versus vasogenic edema.  Stroke code called immediately.  DDX considered: Brain metastasis, hemorrhagic conversion, seizure, stroke, intracranial hemorrhage, atypical migraine.    The patient is not in status epilepticus    Plan:   Close observation  Cardiac monitoring    Orders as below:  Orders Placed This Encounter   ??? CT HEAD WO CONT   ??? CBC WITH AUTOMATED DIFF   ??? COMPREHENSIVE METABOLIC PANEL   ??? Basic Metabolic Panel (BMP)   ??? Lab PT/INR   ??? Troponin I   ??? DIET NPO   ??? NURSING-MISCELLANEOUS: Activate CODE STROKE ONE TIME   ??? Perform NIH Stroke Scale   ??? Nursing Dysphagia Screen (STAND)   ??? POC GLUCOSE   ??? NEURO CHECKS q1h x 4 hours   ??? Consult Neurology   ??? POC PT/INR   ??? GLUCOSE, POC   ??? Initial ECG   ??? LORazepam (ATIVAN) 2 mg/mL injection   ??? levETIRAcetam (  KEPPRA) 2,500 mg in 0.9% sodium chloride 250 mL IVPB   ??? LORazepam (ATIVAN) injection 2 mg   ??? Consult  TELE-NEUROLOGY        ED Course:   Patient CT scan initially demonstrated a mass in the corresponding neurologic area near the precentral gyrus, causing moderate amount of vasogenic edema consistent with the patient presentation.  There does not seem to be any active bleeding at this site.  Patient had responded well to 2 mg of Ativan and approximately 60 mg/kg of levetiracetam.  Patient repeat neuro exam is that she is somewhat sedated.    Multiple conversations had with neurology.  They agree with the plan.    Radiologist called to confirm our findings on CT.    Multiple conversations with the patient's family having had.  Given the patient is somewhat sedated, the patient's daughter is acting as her advocate.    Spoke with neurosurgery at [facility], who recommends admission to the neuro ICU.  Spoke to the neuro intensivist, who observe the patient.  They are happy with the patient's management at this time.     Patient stable at this time.            Critical Care Time:  The services I provided to this patient were to treat and/or prevent clinically significant deterioration that could result in the failure of one or more body systems and/or organ systems due to status epilepticus (complex partial) secondary to brain metastasis and new diagnosis of stage IV cancer.    Services included the following:  -reviewing nursing notes and old charts  -vital sign assessments  -direct patient care  -medication orders and management  -interpreting and reviewing diagnostic studies/labs  -re-evaluations  -documentation time    Aggregate critical care time was 50 minutes, which includes only time during which I was engaged in work directly related to the patient's care as described above, whether I was at bedside or elsewhere in the Emergency Department. It did not include time spent performing other reported procedures or the services of residents, students, nurses, or advance practice providers.       Megan Salon, MD    11:25 PM      Diagnosis     Clinical Impression:   1. 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 Metastasis to brain [facility])        Signed,  Taleyah Hillman Geni Bers, MD  Emergency Physician  EMA  [place]    As a voice dictation software was utilized to dictate this note, minor word transpositions can occur.  I apologize for confusing wording and typographic errors.  Please feel free to contact me for clarification.

## 2019-06-12 NOTE — ED Notes (Signed)
Pt en route to [facility].  Pt remains rousable but more verbal and physical stimulation needed to wake pt and she maintains a drowsy affect.  She does follow commands.  No facial droop noted, airway remains patent.  Pt Pt able to converse and state her name/DOB but reports current month as December.  No other changes noted.  Dr Lovena Le notified; pt cleared for transfer

## 2019-06-12 NOTE — Other (Signed)
TRANSFER - OUT REPORT:    Verbal report given to Deborah Chalk (name) on Kristen Ramsey  being transferred to [facility] [phone] (unit) for treatment for cancer/seizure/brain mass     Report consisted of patient???s Situation, Background, Assessment and   Recommendations(SBAR).  Pt hx/allergy/complaints/findings/treatments/plan of care explained/understood.     Information from the following report(s): ED summary.  was reviewed with the receiving nurse.    Lines:   Peripheral IV 06/12/19 Right Antecubital (Active)        Opportunity for questions and clarification was provided.      Patient transported with:  [facility] transport

## 2019-06-12 NOTE — ED Notes (Signed)
Pt asleep; does rouse with gentle/repeated touch and verbal cues.  Pt able to state name/COB/currrent month/year.  Follows commands; is able to move all extremities with decreased grip to right hand and with decreased sensation to right upper/lower extremity.  Pupils PERRL/brisk 5 to 2, no facial droop, no airway compromise noted.  Rails up x 2, call light in reach, seizure pads in place.

## 2019-06-13 ENCOUNTER — Inpatient Hospital Stay
Admit: 2019-06-13 | Discharge: 2019-06-13 | Disposition: A | Discharge status: Other Acute Inpatient Hospital | Payer: MEDICARE | Attending: Emergency Medicine

## 2019-06-13 LAB — METABOLIC PANEL, COMPREHENSIVE
A-G Ratio: 0.5 — ABNORMAL LOW (ref 0.8–1.7)
ALT (SGPT): 9 U/L — ABNORMAL LOW (ref 13–56)
AST (SGOT): 8 U/L — ABNORMAL LOW (ref 10–38)
Albumin: 2.5 g/dL — ABNORMAL LOW (ref 3.4–5.0)
Alk. phosphatase: 60 U/L (ref 45–117)
Anion gap: 6 mmol/L (ref 3.0–18)
BUN/Creatinine ratio: 11 — ABNORMAL LOW (ref 12–20)
BUN: 7 MG/DL (ref 7.0–18)
Bilirubin, total: 0.2 MG/DL (ref 0.2–1.0)
CO2: 28 mmol/L (ref 21–32)
Calcium: 8.3 MG/DL — ABNORMAL LOW (ref 8.5–10.1)
Chloride: 102 mmol/L (ref 100–111)
Creatinine: 0.63 MG/DL (ref 0.6–1.3)
GFR est AA: >60 mL/min/{1.73_m2} (ref >60)
GFR est non-AA: >60 mL/min/{1.73_m2} (ref >60)
Globulin: 4.9 g/dL — ABNORMAL HIGH (ref 2.0–4.0)
Glucose: 106 mg/dL — ABNORMAL HIGH (ref 74–99)
Potassium: 3.3 mmol/L — ABNORMAL LOW (ref 3.5–5.5)
Protein, total: 7.4 g/dL (ref 6.4–8.2)
Sodium: 136 mmol/L (ref 136–145)

## 2019-06-13 LAB — CBC WITH AUTOMATED DIFF
ABS. BASOPHILS: 0 10*3/uL (ref 0.0–0.1)
ABS. EOSINOPHILS: 0.2 10*3/uL (ref 0.0–0.4)
ABS. LYMPHOCYTES: 2.1 10*3/uL (ref 0.9–3.6)
ABS. MONOCYTES: 1.5 10*3/uL — ABNORMAL HIGH (ref 0.05–1.2)
ABS. NEUTROPHILS: 7.6 10*3/uL (ref 1.8–8.0)
BASOPHILS: 0 % (ref 0–2)
EOSINOPHILS: 2 % (ref 0–5)
HCT: 27.3 % — ABNORMAL LOW (ref 35.0–45.0)
HGB: 8.2 g/dL — ABNORMAL LOW (ref 12.0–16.0)
LYMPHOCYTES: 18 % — ABNORMAL LOW (ref 21–52)
MCH: 18.9 PG — ABNORMAL LOW (ref 24.0–34.0)
MCHC: 30 g/dL — ABNORMAL LOW (ref 31.0–37.0)
MCV: 62.9 FL — ABNORMAL LOW (ref 74.0–97.0)
MONOCYTES: 13 % — ABNORMAL HIGH (ref 3–10)
MPV: 9.2 FL (ref 9.2–11.8)
NEUTROPHILS: 67 % (ref 40–73)
PLATELET COMMENTS: INCREASED
PLATELET: 727 10*3/uL — ABNORMAL HIGH (ref 135–420)
RBC: 4.34 M/uL (ref 4.20–5.30)
RDW: 16.5 % — ABNORMAL HIGH (ref 11.6–14.5)
WBC: 11.4 10*3/uL (ref 4.6–13.2)

## 2019-06-13 LAB — GLUCOSE, POC: Glucose (POC): 100 mg/dL (ref 70–110)

## 2019-06-13 LAB — PROTHROMBIN TIME + INR
INR: 1.1 (ref 0.8–1.2)
Prothrombin time: 13.9 s (ref 11.5–15.2)

## 2019-06-13 LAB — TROPONIN I: Troponin-I, QT: <0.02 NG/ML (ref 0.0–0.045)

## 2019-06-13 MED ORDER — LORAZEPAM 2 MG/ML IJ SOLN
2 mg/mL | INTRAMUSCULAR | Status: AC
Start: 2019-06-13 — End: 2019-06-12
  Administered 2019-06-13: 03:00:00 via INTRAVENOUS

## 2019-06-13 MED ORDER — SODIUM CHLORIDE 0.9 % IV
5005 mg/5 mL | Freq: Once | INTRAVENOUS | Status: AC
Start: 2019-06-13 — End: 2019-06-12
  Administered 2019-06-13: 03:00:00 via INTRAVENOUS

## 2019-06-13 MED ORDER — LORAZEPAM 2 MG/ML IJ SOLN
2 mg/mL | INTRAMUSCULAR | Status: AC
Start: 2019-06-13 — End: 2019-06-12

## 2019-06-13 MED FILL — LEVETIRACETAM 500 MG/5 ML IV SOLN: 500 mg/5 mL | INTRAVENOUS | Qty: 25

## 2019-06-13 MED FILL — LORAZEPAM 2 MG/ML IJ SOLN: 2 mg/mL | INTRAMUSCULAR | Qty: 1

## 2019-06-13 NOTE — ED Notes (Signed)
Note: I am entering this pt chart to complete further documentation.

## 2019-06-14 LAB — HEMOGLOBIN A1C W/O EAG: Hemoglobin A1c (POC): 5.9 %

## 2019-07-07 ENCOUNTER — Telehealth: Admit: 2019-07-07 | Discharge: 2019-07-07 | Payer: MEDICARE | Attending: Family Medicine | Primary: Family Medicine

## 2019-07-07 DIAGNOSIS — Z7409 Other reduced mobility: Secondary | ICD-10-CM

## 2019-07-07 NOTE — Progress Notes (Addendum)
Kristen Ramsey presents today for   Chief Complaint   Patient presents with   ??? Hypertension     Follow up   ??? Anemia     Follow up    ??? Cholesterol Problem     Follow up       Virtual/telephone visit    Depression Screening:  3 most recent PHQ Screens 09/01/2018   PHQ Not Done -   Little interest or pleasure in doing things Not at all   Feeling down, depressed, irritable, or hopeless Not at all   Total Score PHQ 2 0   Trouble falling or staying asleep, or sleeping too much -   Feeling tired or having little energy -   Poor appetite, weight loss, or overeating -   Feeling bad about yourself - or that you are a failure or have let yourself or your family down -   Trouble concentrating on things such as school, work, reading, or watching TV -   Moving or speaking so slowly that other people could have noticed; or the opposite being so fidgety that others notice -   Thoughts of being better off dead, or hurting yourself in some way -   PHQ 9 Score -   How difficult have these problems made it for you to do your work, take care of your home and get along with others -       Learning Assessment:  Learning Assessment 09/01/2018   PRIMARY LEARNER Patient   HIGHEST LEVEL OF EDUCATION - PRIMARY LEARNER  SOME COLLEGE   BARRIERS PRIMARY LEARNER NONE   CO-LEARNER CAREGIVER No   PRIMARY LANGUAGE ENGLISH   LEARNER PREFERENCE PRIMARY LISTENING   ANSWERED BY self   RELATIONSHIP SELF       Fall Risk  Fall Risk Assessment, last 12 mths 09/01/2018   Able to walk? Yes   Fall in past 12 months? No   Fall with injury? -   Number of falls in past 12 months -   Fall Risk Score -       Travel Screening:   Travel Screening     Question   Response    In the last month, have you been in contact with someone who was confirmed or suspected to have Coronavirus / COVID-19?  No / Unsure    Have you had a COVID-19 viral test in the last 14 days?  No    Do you have any of the following new or worsening symptoms?  None of these     Have you traveled internationally in the last month?  No      Travel History   Travel since 06/07/19     No documented travel since 06/07/19          Health Maintenance reviewed and discussed and ordered per Provider.    Health Maintenance Due   Topic Date Due   ??? Pneumococcal 0-64 years (1 of 3 - PCV13) 06/09/1962   ??? Foot Exam Q1  06/09/1966   ??? MICROALBUMIN Q1  06/09/1966   ??? Eye Exam Retinal or Dilated  06/09/1966   ??? Shingrix Vaccine Age 54> (1 of 2) 06/09/2006   ??? Flu Vaccine (1) 03/23/2019   ??? A1C test (Diabetic or Prediabetic)  05/28/2019   ??? Lipid Screen  05/28/2019   .      Coordination of Care:  1. Have you been to the ER, urgent care clinic since your last visit? Hospitalized since your last  visit? [facility],  ED related to seizure.       2. Have you seen or consulted any other health care providers outside of the [facility] since your last visit? Include any pap smears or colon screening. No

## 2019-07-07 NOTE — Progress Notes (Signed)
Kristen Ramsey is a 63 y.o. female who was seen by synchronous (real-time) audio-video technology on 07/07/2019 for Hypertension (Follow up), Anemia (Follow up ), and Cholesterol Problem (Follow up)        Assessment & Plan:   Diagnoses and all orders for this visit:    1. Essential hypertension  Stable, cont pres tx plan.     2. 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 Brain metastases ([place])  4. Seizure [facility])  Care as per oncology team    5. Impaired mobility  -     Shower Chair XX devi; Use when showering to prevent falls.  Z74.09    6. Incontinence of feces, unspecified fecal incontinence type  -     Shower Chair XX devi; Use when showering to prevent falls.  Z74.09  -     brief disposable (adult) misc; by Does Not Apply route. R15.9   Z74.09      The complexity of medical decision making for this visit is moderate   Follow-up and Dispositions    ?? Return in about 2 months (around 09/07/2019) for high blood pressure.           712  Subjective:   Patient contacted via doxy.me.      Pt has been dx with brain mets from her stomach cancer since her last appt.  Pt began having seizures at home.  She went to the ER for eval.  She was admitted to [facility].  Pt was d/c'ed with decadron and anti-epileptic meds.  She has not had any further seizures.  Pt did undergo radiation treatments x 3 at [facility].  Pt is not interested in chemorx.      Pt would like to get a shower chair.  She also would benefit from adult disposable undergarments.  She has been having fecal incontinence.  Her mobility is also impaired.      Pt is planning to get a flu shot and shingles vaccination once she feels that she is doing better.    Prior to Admission medications    Medication Sig Start Date End Date Taking? Authorizing Provider   Shower Chair XX devi Use when showering to prevent falls.  Z74.09 07/09/19  Yes Albin Felling, MD   brief disposable (adult) misc by Does Not Apply route. R15.9   Z74.09 07/09/19  Yes Albin Felling, MD    dexAMETHasone (DECADRON) 2 mg tablet  06/17/19  Yes Provider, Historical   ferrous sulfate 325 mg (65 mg iron) tablet Take 325 mg by mouth daily. 06/16/19 07/16/19 Yes Provider, Historical   levETIRAcetam (KEPPRA) 500 mg tablet  06/17/19  Yes Provider, Historical   ondansetron (ZOFRAN ODT) 8 mg disintegrating tablet  07/05/19  Yes Provider, Historical   promethazine (PHENERGAN) 12.5 mg tablet  07/05/19  Yes Provider, Historical   ferrous gluconate (FERGON) 240 mg (27 mg iron) tablet  03/16/19  Yes Provider, Historical   HYDROcodone-acetaminophen (NORCO) 5-325 mg per tablet TK 1 T PO  Q 4 H PRN P 03/16/19  Yes Provider, Historical   omeprazole (PRILOSEC) 40 mg capsule omeprazole 40 mg capsule,delayed release   TAKE 1 CAPSULE BY MOUTH EVERY DAY   Yes Provider, Historical   losartan (COZAAR) 50 mg tablet Take 1 Tab by mouth daily. 05/26/18  Yes Albin Felling, MD   esomeprazole (NEXIUM) 40 mg capsule Take 1 Cap by mouth daily. 02/23/18  Yes Albin Felling, MD   oxyCODONE-acetaminophen (PERCOCET) 5-325 mg per tablet TK  1 TO 2 TS PO Q 4 TO 6 H PRF PAIN 06/07/19   Provider, Historical   aspirin 81 mg chewable tablet Take 81 mg by mouth daily.    Provider, Historical   albuterol (PROVENTIL HFA, VENTOLIN HFA, PROAIR HFA) 90 mcg/actuation inhaler Take 2 Puffs by inhalation every four (4) hours as needed for Shortness of Breath. 05/27/18   Albin Felling, MD   hydrOXYzine HCl (ATARAX) 25 mg tablet Take 25 mg by mouth. 07/31/17   Provider, Historical   gabapentin (NEURONTIN) 300 mg capsule Take 300 mg by mouth three (3) times daily.    Provider, Historical     Patient Active Problem List   Diagnosis Code   ??? Weight gain R63.5   ??? Tobacco user Z72.0   ??? Pre-diabetes R73.03   ??? Pain of left great toe M79.675   ??? Osteoporosis M81.0   ??? Mixed anxiety and depressive disorder F41.8   ??? Hyperlipidemia E78.5   ??? History of myocardial infarction I25.2   ??? Gout M10.9   ??? Gastroesophageal reflux disease K21.9   ??? Essential hypertension I10    ??? Diabetes mellitus ([place]) E11.9   ??? Coronary arteriosclerosis I25.10   ??? Chest pain at rest R07.9   ??? Arthritis M19.90   ??? 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 Brain mass G93.89   ??? Moderate protein-calorie malnutrition (HCC) E44.0     Current Outpatient Medications   Medication Sig Dispense Refill   ??? Shower Chair XX devi Use when showering to prevent falls.  Z74.09 1 Each 0   ??? brief disposable (adult) misc by Does Not Apply route. R15.9   Z74.09 3 Package 11   ??? dexAMETHasone (DECADRON) 2 mg tablet      ??? ferrous sulfate 325 mg (65 mg iron) tablet Take 325 mg by mouth daily.     ??? levETIRAcetam (KEPPRA) 500 mg tablet      ??? ondansetron (ZOFRAN ODT) 8 mg disintegrating tablet      ??? promethazine (PHENERGAN) 12.5 mg tablet      ??? ferrous gluconate (FERGON) 240 mg (27 mg iron) tablet      ??? HYDROcodone-acetaminophen (NORCO) 5-325 mg per tablet TK 1 T PO  Q 4 H PRN P     ??? omeprazole (PRILOSEC) 40 mg capsule omeprazole 40 mg capsule,delayed release   TAKE 1 CAPSULE BY MOUTH EVERY DAY     ??? losartan (COZAAR) 50 mg tablet Take 1 Tab by mouth daily. 90 Tab 3   ??? esomeprazole (NEXIUM) 40 mg capsule Take 1 Cap by mouth daily. 30 Cap 5   ??? oxyCODONE-acetaminophen (PERCOCET) 5-325 mg per tablet TK 1 TO 2 TS PO Q 4 TO 6 H PRF PAIN     ??? aspirin 81 mg chewable tablet Take 81 mg by mouth daily.     ??? albuterol (PROVENTIL HFA, VENTOLIN HFA, PROAIR HFA) 90 mcg/actuation inhaler Take 2 Puffs by inhalation every four (4) hours as needed for Shortness of Breath. 1 Inhaler 3   ??? hydrOXYzine HCl (ATARAX) 25 mg tablet Take 25 mg by mouth.     ??? gabapentin (NEURONTIN) 300 mg capsule Take 300 mg by mouth three (3) times daily.       Allergies   Allergen Reactions   ??? Codeine Seizures     Other reaction(s): Other (See Comments)  seizures  Other reaction(s): neurological reaction   ??? Tomato Hives   ??? Lisinopril Cough  Past Medical History:   Diagnosis Date   ??? Arthritis    ??? CAD (coronary artery disease) 2005    MI    ??? Chronic obstructive pulmonary disease ([place])    ??? Depression    ??? Elevated glucose    ??? Glaucoma    ??? Gout    ??? HTN (hypertension)    ??? Joint pain    ??? Malignant neoplasm of cardia of stomach [facility])      Past Surgical History:   Procedure Laterality Date   ??? COLONOSCOPY N/A 09/03/2017    COLONOSCOPY / polypectomy performed by Jannet Mantis, MD at [facility]   ??? HX ORTHOPAEDIC      Left hand repair due to work injury   ??? HX TUBAL LIGATION       Family History   Problem Relation Age of Onset   ??? Stroke Mother    ??? Alzheimer Mother    ??? Cancer Father    ??? Diabetes Brother    ??? Dementia Brother      Social History     Tobacco Use   ??? Smoking status: Former Smoker     Quit date: 02/05/2019     Years since quitting: 0.4   ??? Smokeless tobacco: Never Used   Substance Use Topics   ??? Alcohol use: Yes     Alcohol/week: 2.0 standard drinks     Types: 2 Glasses of wine per week       Review of Systems   Constitutional: Negative.    HENT: Negative.    Respiratory: Negative.    Cardiovascular: Negative.    Gastrointestinal:        Fecal incontinence   Neurological: Positive for seizures.   All other systems reviewed and are negative.      Objective:   No flowsheet data found.   General: alert, cooperative, no distress   Mental  status: normal mood, behavior, speech, dress, motor activity, and thought processes, able to follow commands   HENT: NCAT   Neck: no visualized mass   Resp: no respiratory distress   Neuro: no gross deficits   Skin: no discoloration or lesions of concern on visible areas   Psychiatric: normal affect, consistent with stated mood, no evidence of hallucinations     Additional exam findings: none       We discussed the expected course, resolution and complications of the diagnosis(es) in detail.  Medication risks, benefits, costs, interactions, and alternatives were discussed as indicated.  I advised her to contact the office if her condition worsens, changes or fails to improve as anticipated. She expressed understanding with the diagnosis(es) and plan.       Kristen Ramsey, who was evaluated through a patient-initiated, synchronous (real-time) audio-video encounter, and/or her healthcare decision maker, is aware that it is a billable service, with coverage as determined by her insurance carrier. She provided verbal consent to proceed: n/a- consent obtained within past 12 months, and patient identification was verified. It was conducted pursuant to the emergency declaration under the [place] waiver authority and the [employer] Act. A caregiver was present when appropriate. Ability to conduct physical exam was limited. I was in the office. The patient was at home.      Albin Felling, MD

## 2019-07-09 MED ORDER — DIAPER,BRIEF, ADULT,DISPOSABLE
PACK | 11 refills | Status: AC
Start: 2019-07-09 — End: ?

## 2019-07-09 MED ORDER — SHOWER CHAIR
0 refills | Status: AC
Start: 2019-07-09 — End: ?

## 2019-07-29 ENCOUNTER — Encounter

## 2019-08-06 ENCOUNTER — Encounter

## 2019-09-08 ENCOUNTER — Telehealth: Admit: 2019-09-08 | Discharge: 2019-09-08 | Payer: MEDICARE | Attending: Family Medicine | Primary: Family Medicine

## 2019-09-08 DIAGNOSIS — I10 Essential (primary) hypertension: Secondary | ICD-10-CM

## 2019-09-08 NOTE — Assessment & Plan Note (Signed)
This condition is managed by Specialist.

## 2019-09-08 NOTE — Progress Notes (Signed)
Kristen Ramsey is a 64 y.o. female who was seen by synchronous (real-time) audio-video technology on 09/08/2019 for Follow-up        Assessment & Plan:   Diagnoses and all orders for this visit:    1. Essential hypertension  Stable, cont pres tx plan.     2. Moderate protein-calorie malnutrition ([place])  Assessment & Plan:  This condition is managed by Specialist.      3. Seizure [facility])  Assessment & Plan:  This condition is managed by Specialist.       4. 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       5. Brain metastases [facility])  Assessment & Plan:  This condition is managed by Specialist.      6. Oral lesion  eval tomorrow with ENT at [facility].  Discussed with pt and dtr that a bx will likely be needed to understand etiology of this lesion.    dtr will call back tomorrow after that appt with an update.     7. Impaired mobility  8. Incontinence of feces, unspecified fecal incontinence type  Still in need of shower chair.  Form has been sent back to the dme supplier already.  Contact info given so they can f/u.  Still in need of adult disposable undergarments. dtr will ask dme supplier if they are able to provide this also.      The complexity of medical decision making for this visit is moderate   Follow-up and Dispositions    ?? Return in about 2 months (around 11/06/2019).           712  Subjective:   Patient contacted via [email or web address].      Pt has had ER visits for dental abscess, hematemesis, and weakness in the last few weeks.  At her last visit 2 days ago, she was dx with uti.  Pt started her abx yesterday.  The weakness has improved a little.      Bp was wnl at her most recent ER visit.  Pt is down to 93 lbs.       Pt now has a mass in her mouth that she feels came up 2 wks ago.  It has displaced her teeth.    She has had a f/u with her med onc but was told that there is nothing they could do.  Pt's dtr took her a dentist; they have referred her to ENT at [facility].  She will see them tomorrow morning for eval of the oral lesion.        Prior to Admission medications    Medication Sig Start Date End Date Taking? Authorizing Provider   cephALEXin (KEFLEX) 500 mg capsule  09/07/19  Yes Provider, Historical   penicillin v potassium (VEETID) 500 mg tablet  08/13/19  Yes Provider, Historical   HYDROmorphone (DILAUDID) 2 mg tablet TAKE 1 TABLET BY MOUTH EVERY 4 HOURS AS NEEDED PAIN 09/01/19  Yes Provider, Historical   dexAMETHasone (DECADRON) 4 mg tablet  09/07/19  Yes Provider, Historical   Shower Chair XX devi Use when showering to prevent falls.  Z74.09 07/09/19  Yes Albin Felling, MD   brief disposable (adult) misc by Does Not Apply route. R15.9   Z74.09 07/09/19  Yes Albin Felling, MD   dexAMETHasone (DECADRON) 2 mg tablet  06/17/19  Yes Provider, Historical   levETIRAcetam (KEPPRA) 500 mg tablet  06/17/19  Yes Provider,  Historical   ondansetron (ZOFRAN ODT) 8 mg disintegrating tablet  07/05/19  Yes Provider, Historical   oxyCODONE-acetaminophen (PERCOCET) 5-325 mg per tablet TK 1 TO 2 TS PO Q 4 TO 6 H PRF PAIN 06/07/19  Yes Provider, Historical   promethazine (PHENERGAN) 12.5 mg tablet  07/05/19  Yes Provider, Historical   ferrous gluconate (FERGON) 240 mg (27 mg iron) tablet  03/16/19  Yes Provider, Historical   HYDROcodone-acetaminophen (NORCO) 5-325 mg per tablet TK 1 T PO  Q 4 H PRN P 03/16/19  Yes Provider, Historical   omeprazole (PRILOSEC) 40 mg capsule omeprazole 40 mg capsule,delayed release   TAKE 1 CAPSULE BY MOUTH EVERY DAY   Yes Provider, Historical   aspirin 81 mg chewable tablet Take 81 mg by mouth daily.   Yes Provider, Historical    albuterol (PROVENTIL HFA, VENTOLIN HFA, PROAIR HFA) 90 mcg/actuation inhaler Take 2 Puffs by inhalation every four (4) hours as needed for Shortness of Breath. 05/27/18  Yes Albin Felling, MD   esomeprazole (NEXIUM) 40 mg capsule Take 1 Cap by mouth daily. 02/23/18  Yes Albin Felling, MD   losartan (COZAAR) 50 mg tablet Take 1 Tab by mouth daily. 05/26/18   Albin Felling, MD   hydrOXYzine HCl (ATARAX) 25 mg tablet Take 25 mg by mouth. 07/31/17   Provider, Historical   gabapentin (NEURONTIN) 300 mg capsule Take 300 mg by mouth three (3) times daily.    Provider, Historical     Patient Active Problem List   Diagnosis Code   ??? Weight gain R63.5   ??? Tobacco user Z72.0   ??? Pre-diabetes R73.03   ??? Pain of left great toe M79.675   ??? Osteoporosis M81.0   ??? Mixed anxiety and depressive disorder F41.8   ??? Hyperlipidemia E78.5   ??? History of myocardial infarction I25.2   ??? Gout M10.9   ??? Gastroesophageal reflux disease K21.9   ??? Essential hypertension I10   ??? Coronary arteriosclerosis I25.10   ??? Chest pain at rest R07.9   ??? Arthritis M19.90   ??? Adenocarcinoma of gastroesophageal junction (HCC) C16.0   ??? Brain mass G93.89   ??? Moderate protein-calorie malnutrition (HCC) E44.0   ??? Brain metastases (HCC) C79.31   ??? Seizure ([place]) R56.9     Current Outpatient Medications   Medication Sig Dispense Refill   ??? cephALEXin (KEFLEX) 500 mg capsule      ??? penicillin v potassium (VEETID) 500 mg tablet      ??? HYDROmorphone (DILAUDID) 2 mg tablet TAKE 1 TABLET BY MOUTH EVERY 4 HOURS AS NEEDED PAIN     ??? dexAMETHasone (DECADRON) 4 mg tablet      ??? Shower Chair XX devi Use when showering to prevent falls.  Z74.09 1 Each 0   ??? brief disposable (adult) misc by Does Not Apply route. R15.9   Z74.09 3 Package 11   ??? dexAMETHasone (DECADRON) 2 mg tablet      ??? levETIRAcetam (KEPPRA) 500 mg tablet      ??? ondansetron (ZOFRAN ODT) 8 mg disintegrating tablet       ??? oxyCODONE-acetaminophen (PERCOCET) 5-325 mg per tablet TK 1 TO 2 TS PO Q 4 TO 6 H PRF PAIN     ??? promethazine (PHENERGAN) 12.5 mg tablet      ??? ferrous gluconate (FERGON) 240 mg (27 mg iron) tablet      ??? HYDROcodone-acetaminophen (NORCO) 5-325 mg per tablet TK 1 T PO  Q 4 H  PRN P     ??? omeprazole (PRILOSEC) 40 mg capsule omeprazole 40 mg capsule,delayed release   TAKE 1 CAPSULE BY MOUTH EVERY DAY     ??? aspirin 81 mg chewable tablet Take 81 mg by mouth daily.     ??? albuterol (PROVENTIL HFA, VENTOLIN HFA, PROAIR HFA) 90 mcg/actuation inhaler Take 2 Puffs by inhalation every four (4) hours as needed for Shortness of Breath. 1 Inhaler 3   ??? esomeprazole (NEXIUM) 40 mg capsule Take 1 Cap by mouth daily. 30 Cap 5   ??? losartan (COZAAR) 50 mg tablet Take 1 Tab by mouth daily. 90 Tab 3   ??? hydrOXYzine HCl (ATARAX) 25 mg tablet Take 25 mg by mouth.     ??? gabapentin (NEURONTIN) 300 mg capsule Take 300 mg by mouth three (3) times daily.       Allergies   Allergen Reactions   ??? Codeine Seizures     Other reaction(s): Other (See Comments)  seizures  Other reaction(s): neurological reaction   ??? Tomato Hives   ??? Lisinopril Cough     Past Medical History:   Diagnosis Date   ??? Arthritis    ??? CAD (coronary artery disease) 2005    MI   ??? Chronic obstructive pulmonary disease ([place])    ??? Depression    ??? Elevated glucose    ??? Glaucoma    ??? Gout    ??? HTN (hypertension)    ??? Joint pain    ??? Malignant neoplasm of cardia of stomach [facility])      Past Surgical History:   Procedure Laterality Date   ??? COLONOSCOPY N/A 09/03/2017    COLONOSCOPY / polypectomy performed by Jannet Mantis, MD at [facility]   ??? HX ORTHOPAEDIC      Left hand repair due to work injury   ??? HX TUBAL LIGATION       Family History   Problem Relation Age of Onset   ??? Stroke Mother    ??? Alzheimer Mother    ??? Cancer Father    ??? Diabetes Brother    ??? Dementia Brother      Social History     Tobacco Use   ??? Smoking status: Former Smoker     Quit date: 02/05/2019      Years since quitting: 0.5   ??? Smokeless tobacco: Never Used   Substance Use Topics   ??? Alcohol use: Yes     Alcohol/week: 2.0 standard drinks     Types: 2 Glasses of wine per week       Review of Systems   Constitutional: Positive for malaise/fatigue.   HENT:        Lesion in mouth   Respiratory: Negative.    Cardiovascular: Negative.    Neurological: Positive for weakness.   All other systems reviewed and are negative.      Objective:   No flowsheet data found.   General: alert, cooperative, no distress   Mental  status: normal mood, behavior, speech, dress, motor activity, and thought processes, able to follow commands   HENT: NCAT, oral lesion in right upper mouth displacing pt's teeth laterally   Neck: no visualized mass   Resp: no respiratory distress   Neuro: no gross deficits   Skin: no discoloration or lesions of concern on visible areas   Psychiatric: normal affect, consistent with stated mood, no evidence of hallucinations     Additional exam findings: none      We  discussed the expected course, resolution and complications of the diagnosis(es) in detail.  Medication risks, benefits, costs, interactions, and alternatives were discussed as indicated.  I advised her to contact the office if her condition worsens, changes or fails to improve as anticipated. She expressed understanding with the diagnosis(es) and plan.        Alto Denver, who was evaluated through a patient-initiated, synchronous (real-time) audio-video encounter, and/or her healthcare decision maker, is aware that it is a billable service, with coverage as determined by her insurance carrier. She provided verbal consent to proceed: n/a- consent obtained within past 12 months, and patient identification was verified. It was conducted pursuant to the emergency declaration under the [place], [place] waiver authority and the [employer] Act. A caregiver was present when appropriate. Ability to conduct physical exam was limited. I was in the office. The patient was at home.      Albin Felling, MD

## 2019-09-08 NOTE — Progress Notes (Addendum)
Kristen Ramsey presents today for   Chief Complaint   Patient presents with   ??? Follow-up       Virtual/telephone visit    Depression Screening:  3 most recent PHQ Screens 09/08/2019   PHQ Not Done -   Little interest or pleasure in doing things Several days   Feeling down, depressed, irritable, or hopeless Several days   Total Score PHQ 2 2   Trouble falling or staying asleep, or sleeping too much -   Feeling tired or having little energy -   Poor appetite, weight loss, or overeating -   Feeling bad about yourself - or that you are a failure or have let yourself or your family down -   Trouble concentrating on things such as school, work, reading, or watching TV -   Moving or speaking so slowly that other people could have noticed; or the opposite being so fidgety that others notice -   Thoughts of being better off dead, or hurting yourself in some way -   PHQ 9 Score -   How difficult have these problems made it for you to do your work, take care of your home and get along with others -       Learning Assessment:  Learning Assessment 09/08/2019   PRIMARY LEARNER Patient   HIGHEST LEVEL OF EDUCATION - PRIMARY LEARNER  SOME COLLEGE   BARRIERS PRIMARY LEARNER NONE   CO-LEARNER CAREGIVER No   PRIMARY LANGUAGE ENGLISH   LEARNER PREFERENCE PRIMARY DEMONSTRATION   ANSWERED BY Patient   RELATIONSHIP SELF       Fall Risk  Fall Risk Assessment, last 12 mths 09/08/2019   Able to walk? Yes   Fall in past 12 months? 0   Do you feel unsteady? 1   Are you worried about falling 1   Is the gait abnormal? 0   Number of falls in past 12 months -   Fall with injury? -       Travel Screening:   Travel Screening      No screening recorded since 09/07/19 0000      Travel History   Travel since 08/08/19     No documented travel since 08/08/19          Health Maintenance reviewed and discussed and ordered per Provider.    Health Maintenance Due   Topic Date Due   ??? Pneumococcal 0-64 years (1 of 3 - PCV13) 06/09/1962    ??? Foot Exam Q1  06/09/1966   ??? MICROALBUMIN Q1  06/09/1966   ??? Eye Exam Retinal or Dilated  06/09/1966   ??? COVID-19 Vaccine (1 of 2) 06/09/1972   ??? Lipid Screen  05/28/2019   .      Coordination of Care:  1. Have you been to the ER, urgent care clinic since your last visit? Hospitalized since your last visit? No    2. Have you seen or consulted any other health care providers outside of the [facility] since your last visit? Include any pap smears or colon screening. No

## 2019-09-08 NOTE — Assessment & Plan Note (Signed)
This condition is managed by Specialist., now with brain mets.

## 2019-09-24 NOTE — Telephone Encounter (Signed)
Patient expired on 2021-03-15at 1:57 at home per [facility] with [facility].

## 2019-10-21 DEATH — deceased

## 2019-12-08 ENCOUNTER — Encounter: Attending: Family Medicine | Primary: Family Medicine
